# Patient Record
Sex: Female | Born: 1965 | Race: White | Hispanic: No | Marital: Married | Smoking: Former smoker
Health system: Southern US, Community
[De-identification: ages and names within clinical notes are randomized; demographics above are authoritative.]

## PROBLEM LIST (undated history)

## (undated) DIAGNOSIS — Z923 Personal history of irradiation: Secondary | ICD-10-CM

## (undated) DIAGNOSIS — IMO0001 Reserved for inherently not codable concepts without codable children: Secondary | ICD-10-CM

## (undated) DIAGNOSIS — M069 Rheumatoid arthritis, unspecified: Secondary | ICD-10-CM

## (undated) DIAGNOSIS — C50412 Malignant neoplasm of upper-outer quadrant of left female breast: Principal | ICD-10-CM

## (undated) DIAGNOSIS — IMO0002 Reserved for concepts with insufficient information to code with codable children: Secondary | ICD-10-CM

## (undated) HISTORY — PX: OTHER SURGICAL HISTORY: SHX169

## (undated) HISTORY — DX: Rheumatoid arthritis, unspecified: M06.9

## (undated) HISTORY — PX: EYE SURGERY: SHX253

## (undated) HISTORY — DX: Malignant neoplasm of upper-outer quadrant of left female breast: C50.412

---

## 1999-05-16 ENCOUNTER — Other Ambulatory Visit: Admission: RE | Admit: 1999-05-16 | Discharge: 1999-05-16 | Payer: Self-pay | Admitting: Obstetrics & Gynecology

## 2000-09-16 ENCOUNTER — Other Ambulatory Visit: Admission: RE | Admit: 2000-09-16 | Discharge: 2000-09-16 | Payer: Self-pay | Admitting: Obstetrics & Gynecology

## 2002-04-20 ENCOUNTER — Other Ambulatory Visit: Admission: RE | Admit: 2002-04-20 | Discharge: 2002-04-20 | Payer: Self-pay | Admitting: Obstetrics & Gynecology

## 2003-06-22 ENCOUNTER — Other Ambulatory Visit: Admission: RE | Admit: 2003-06-22 | Discharge: 2003-06-22 | Payer: Self-pay | Admitting: Obstetrics & Gynecology

## 2004-08-23 ENCOUNTER — Other Ambulatory Visit: Admission: RE | Admit: 2004-08-23 | Discharge: 2004-08-23 | Payer: Self-pay | Admitting: Obstetrics & Gynecology

## 2005-03-08 ENCOUNTER — Encounter: Admission: RE | Admit: 2005-03-08 | Discharge: 2005-03-08 | Payer: Self-pay | Admitting: Obstetrics & Gynecology

## 2005-03-26 ENCOUNTER — Encounter: Admission: RE | Admit: 2005-03-26 | Discharge: 2005-03-26 | Payer: Self-pay | Admitting: Obstetrics & Gynecology

## 2006-01-02 ENCOUNTER — Other Ambulatory Visit: Admission: RE | Admit: 2006-01-02 | Discharge: 2006-01-02 | Payer: Self-pay | Admitting: Obstetrics & Gynecology

## 2006-04-02 ENCOUNTER — Encounter: Admission: RE | Admit: 2006-04-02 | Discharge: 2006-04-02 | Payer: Self-pay | Admitting: Obstetrics & Gynecology

## 2007-05-22 ENCOUNTER — Encounter: Admission: RE | Admit: 2007-05-22 | Discharge: 2007-05-22 | Payer: Self-pay | Admitting: Obstetrics & Gynecology

## 2007-06-19 ENCOUNTER — Ambulatory Visit (HOSPITAL_COMMUNITY): Admission: RE | Admit: 2007-06-19 | Discharge: 2007-06-19 | Payer: Self-pay | Admitting: Obstetrics & Gynecology

## 2008-05-31 ENCOUNTER — Encounter: Admission: RE | Admit: 2008-05-31 | Discharge: 2008-05-31 | Payer: Self-pay | Admitting: Obstetrics & Gynecology

## 2009-06-01 ENCOUNTER — Encounter: Admission: RE | Admit: 2009-06-01 | Discharge: 2009-06-01 | Payer: Self-pay | Admitting: Obstetrics & Gynecology

## 2010-06-05 ENCOUNTER — Encounter: Admission: RE | Admit: 2010-06-05 | Discharge: 2010-06-05 | Payer: Self-pay | Admitting: Obstetrics & Gynecology

## 2010-12-31 ENCOUNTER — Encounter: Payer: Self-pay | Admitting: Obstetrics & Gynecology

## 2011-06-07 ENCOUNTER — Other Ambulatory Visit: Payer: Self-pay | Admitting: Obstetrics & Gynecology

## 2011-06-07 DIAGNOSIS — Z1231 Encounter for screening mammogram for malignant neoplasm of breast: Secondary | ICD-10-CM

## 2011-06-21 ENCOUNTER — Ambulatory Visit
Admission: RE | Admit: 2011-06-21 | Discharge: 2011-06-21 | Disposition: A | Discharge status: Home / Self Care | Payer: BC Managed Care – PPO | Source: Ambulatory Visit | Attending: Obstetrics & Gynecology | Admitting: Obstetrics & Gynecology

## 2011-06-21 DIAGNOSIS — Z1231 Encounter for screening mammogram for malignant neoplasm of breast: Secondary | ICD-10-CM

## 2012-05-30 ENCOUNTER — Other Ambulatory Visit: Payer: Self-pay | Admitting: Obstetrics & Gynecology

## 2012-05-30 DIAGNOSIS — Z1231 Encounter for screening mammogram for malignant neoplasm of breast: Secondary | ICD-10-CM

## 2012-06-16 ENCOUNTER — Ambulatory Visit
Admission: RE | Admit: 2012-06-16 | Discharge: 2012-06-16 | Disposition: A | Discharge status: Home / Self Care | Payer: BC Managed Care – PPO | Source: Ambulatory Visit | Attending: Obstetrics & Gynecology | Admitting: Obstetrics & Gynecology

## 2012-06-16 DIAGNOSIS — Z1231 Encounter for screening mammogram for malignant neoplasm of breast: Secondary | ICD-10-CM

## 2012-06-23 ENCOUNTER — Ambulatory Visit: Payer: BC Managed Care – PPO

## 2013-05-28 ENCOUNTER — Other Ambulatory Visit: Payer: Self-pay

## 2013-05-28 DIAGNOSIS — Z1231 Encounter for screening mammogram for malignant neoplasm of breast: Secondary | ICD-10-CM

## 2013-06-23 ENCOUNTER — Ambulatory Visit
Admission: RE | Admit: 2013-06-23 | Discharge: 2013-06-23 | Disposition: A | Discharge status: Home / Self Care | Payer: BC Managed Care – PPO | Source: Ambulatory Visit

## 2013-06-23 DIAGNOSIS — Z1231 Encounter for screening mammogram for malignant neoplasm of breast: Secondary | ICD-10-CM

## 2014-05-27 ENCOUNTER — Other Ambulatory Visit: Payer: Self-pay

## 2014-05-27 DIAGNOSIS — Z1231 Encounter for screening mammogram for malignant neoplasm of breast: Secondary | ICD-10-CM

## 2014-06-24 ENCOUNTER — Ambulatory Visit
Admission: RE | Admit: 2014-06-24 | Discharge: 2014-06-24 | Disposition: A | Discharge status: Home / Self Care | Payer: BC Managed Care – PPO | Source: Ambulatory Visit

## 2014-06-24 DIAGNOSIS — Z1231 Encounter for screening mammogram for malignant neoplasm of breast: Secondary | ICD-10-CM

## 2014-12-10 DIAGNOSIS — Z923 Personal history of irradiation: Secondary | ICD-10-CM

## 2014-12-10 DIAGNOSIS — C50919 Malignant neoplasm of unspecified site of unspecified female breast: Secondary | ICD-10-CM

## 2014-12-10 HISTORY — PX: BREAST LUMPECTOMY: SHX2

## 2014-12-10 HISTORY — DX: Personal history of irradiation: Z92.3

## 2014-12-10 HISTORY — DX: Malignant neoplasm of unspecified site of unspecified female breast: C50.919

## 2015-05-27 ENCOUNTER — Other Ambulatory Visit: Payer: Self-pay

## 2015-05-27 DIAGNOSIS — Z1231 Encounter for screening mammogram for malignant neoplasm of breast: Secondary | ICD-10-CM

## 2015-06-28 ENCOUNTER — Ambulatory Visit: Payer: Self-pay

## 2015-07-11 ENCOUNTER — Ambulatory Visit
Admission: RE | Admit: 2015-07-11 | Discharge: 2015-07-11 | Disposition: A | Discharge status: Home / Self Care | Payer: BLUE CROSS/BLUE SHIELD | Source: Ambulatory Visit

## 2015-07-11 DIAGNOSIS — Z1231 Encounter for screening mammogram for malignant neoplasm of breast: Secondary | ICD-10-CM

## 2015-07-14 ENCOUNTER — Other Ambulatory Visit: Payer: Self-pay | Admitting: Obstetrics & Gynecology

## 2015-07-14 DIAGNOSIS — R928 Other abnormal and inconclusive findings on diagnostic imaging of breast: Secondary | ICD-10-CM

## 2015-07-15 ENCOUNTER — Other Ambulatory Visit: Payer: Self-pay | Admitting: Obstetrics & Gynecology

## 2015-07-15 ENCOUNTER — Ambulatory Visit
Admission: RE | Admit: 2015-07-15 | Discharge: 2015-07-15 | Disposition: A | Discharge status: Home / Self Care | Payer: BLUE CROSS/BLUE SHIELD | Source: Ambulatory Visit | Attending: Obstetrics & Gynecology | Admitting: Obstetrics & Gynecology

## 2015-07-15 DIAGNOSIS — R928 Other abnormal and inconclusive findings on diagnostic imaging of breast: Secondary | ICD-10-CM

## 2015-08-05 ENCOUNTER — Other Ambulatory Visit: Payer: Self-pay | Admitting: Obstetrics & Gynecology

## 2015-08-05 ENCOUNTER — Ambulatory Visit
Admission: RE | Admit: 2015-08-05 | Discharge: 2015-08-05 | Disposition: A | Discharge status: Home / Self Care | Payer: BLUE CROSS/BLUE SHIELD | Source: Ambulatory Visit | Attending: Obstetrics & Gynecology | Admitting: Obstetrics & Gynecology

## 2015-08-05 DIAGNOSIS — R928 Other abnormal and inconclusive findings on diagnostic imaging of breast: Secondary | ICD-10-CM

## 2015-08-11 ENCOUNTER — Other Ambulatory Visit: Payer: Self-pay | Admitting: General Surgery

## 2015-08-11 DIAGNOSIS — C50912 Malignant neoplasm of unspecified site of left female breast: Secondary | ICD-10-CM

## 2015-08-12 ENCOUNTER — Encounter: Payer: Self-pay | Admitting: *Deleted

## 2015-08-12 ENCOUNTER — Other Ambulatory Visit: Payer: Self-pay | Admitting: *Deleted

## 2015-08-12 ENCOUNTER — Telehealth: Payer: Self-pay | Admitting: *Deleted

## 2015-08-12 DIAGNOSIS — Z170 Estrogen receptor positive status [ER+]: Secondary | ICD-10-CM

## 2015-08-12 DIAGNOSIS — C50412 Malignant neoplasm of upper-outer quadrant of left female breast: Secondary | ICD-10-CM

## 2015-08-12 HISTORY — DX: Malignant neoplasm of upper-outer quadrant of left female breast: C50.412

## 2015-08-12 NOTE — Telephone Encounter (Signed)
Scheduled and confirmed new pt appt with Dr. Jana Hakim on 08/16/15 at 4/4:30. Pt relate she does not want to schedule genetics until she has discussed this with Dr. Jana Hakim.

## 2015-08-16 ENCOUNTER — Ambulatory Visit (HOSPITAL_BASED_OUTPATIENT_CLINIC_OR_DEPARTMENT_OTHER): Payer: BLUE CROSS/BLUE SHIELD | Admitting: Oncology

## 2015-08-16 ENCOUNTER — Encounter: Payer: Self-pay | Admitting: Oncology

## 2015-08-16 ENCOUNTER — Other Ambulatory Visit (HOSPITAL_BASED_OUTPATIENT_CLINIC_OR_DEPARTMENT_OTHER): Payer: BLUE CROSS/BLUE SHIELD

## 2015-08-16 VITALS — BP 138/77 | HR 86 | Temp 98.9°F | Resp 18 | Ht 70.0 in | Wt 227.9 lb

## 2015-08-16 DIAGNOSIS — M069 Rheumatoid arthritis, unspecified: Secondary | ICD-10-CM

## 2015-08-16 DIAGNOSIS — Z170 Estrogen receptor positive status [ER+]: Secondary | ICD-10-CM

## 2015-08-16 DIAGNOSIS — C50412 Malignant neoplasm of upper-outer quadrant of left female breast: Secondary | ICD-10-CM

## 2015-08-16 DIAGNOSIS — D0512 Intraductal carcinoma in situ of left breast: Secondary | ICD-10-CM

## 2015-08-16 LAB — CBC WITH DIFFERENTIAL/PLATELET
BASO%: 1 % (ref 0.0–2.0)
Basophils Absolute: 0.1 10e3/uL (ref 0.0–0.1)
EOS%: 2.8 % (ref 0.0–7.0)
Eosinophils Absolute: 0.3 10e3/uL (ref 0.0–0.5)
HCT: 39.9 % (ref 34.8–46.6)
HGB: 13.5 g/dL (ref 11.6–15.9)
LYMPH%: 34.3 % (ref 14.0–49.7)
MCH: 32.9 pg (ref 25.1–34.0)
MCHC: 33.8 g/dL (ref 31.5–36.0)
MCV: 97.3 fL (ref 79.5–101.0)
MONO#: 0.8 10e3/uL (ref 0.1–0.9)
MONO%: 7.1 % (ref 0.0–14.0)
NEUT#: 5.9 10e3/uL (ref 1.5–6.5)
NEUT%: 54.8 % (ref 38.4–76.8)
Platelets: 272 10e3/uL (ref 145–400)
RBC: 4.1 10e6/uL (ref 3.70–5.45)
RDW: 13.1 % (ref 11.2–14.5)
WBC: 10.7 10e3/uL — ABNORMAL HIGH (ref 3.9–10.3)
lymph#: 3.7 10e3/uL — ABNORMAL HIGH (ref 0.9–3.3)

## 2015-08-16 LAB — COMPREHENSIVE METABOLIC PANEL (CC13)
ALBUMIN: 4.2 g/dL (ref 3.5–5.0)
ALK PHOS: 53 U/L (ref 40–150)
ALT: 25 U/L (ref 0–55)
ANION GAP: 9 meq/L (ref 3–11)
AST: 18 U/L (ref 5–34)
BILIRUBIN TOTAL: 0.3 mg/dL (ref 0.20–1.20)
BUN: 11.2 mg/dL (ref 7.0–26.0)
CALCIUM: 9.5 mg/dL (ref 8.4–10.4)
CHLORIDE: 107 meq/L (ref 98–109)
CO2: 23 mEq/L (ref 22–29)
CREATININE: 0.8 mg/dL (ref 0.6–1.1)
EGFR: 90 mL/min/{1.73_m2} — ABNORMAL LOW (ref >90)
Glucose: 104 mg/dl (ref 70–140)
Potassium: 3.6 mEq/L (ref 3.5–5.1)
Sodium: 139 mEq/L (ref 136–145)
TOTAL PROTEIN: 7.5 g/dL (ref 6.4–8.3)

## 2015-08-16 NOTE — Progress Notes (Signed)
[place]  Telephone:(336) [phone] Fax:(336) [phone]     ID: Kim Porter DOB: 11/05/1966  MR#: [id number]  IOX#:[id number]  Patient Care Team: Jefm Petty, MD as PCP - General (Family Medicine) Vania Rea, MD as Consulting Physician (Obstetrics and Gynecology) Chauncey Cruel, MD as Consulting Physician (Oncology) Fanny Skates, MD as Consulting Physician (General Surgery) PCP: Wonda Cerise, MD OTHER MD: Dyke Brackett MD, Nena Polio MD  CHIEF COMPLAINT: ductal carcinoma in situ  CURRENT TREATMENT: awaiting definitive surgery   BREAST CANCER HISTORY: Kim Porter had bilateral screening mammography with tomography 07/11/2015 showing calcifications in the left breast, requiring further imaging.on 07/15/2015 she underwent a diagnostic left mammography which showed the breast density to be category C. 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  Her subsequent history is as detailed below.  INTERVAL HISTORY:  Kim Porter was evaluated in the breast clinic 08/16/2015 accompanied by her mother Kim Porter  REVIEW OF SYSTEMS: There were no specific symptoms leading to the original mammogram, which was routinely scheduled. The patient denies unusual headaches, visual changes, nausea, vomiting, stiff neck, dizziness, or gait imbalance. There has been no cough, phlegm production, or pleurisy, no chest pain or pressure, and no change in bowel or bladder habits. The patient denies fever, rash, bleeding, unexplained fatigue or unexplained weight loss.  The patient does have a history of rheumatoid arthritis. She stopped her medications (the adalimumab  5 weeks ago, the methotrexate 4 weeks ago ) without any evidence of  flare.  In fact she is asymptomatic from this problem.A detailed review of systems was otherwise entirely negative.  PAST MEDICAL HISTORY: Past Medical History  Diagnosis Date  . Breast cancer of upper-outer quadrant of left female breast 08/12/2015  . Rheumatoid arthritis     PAST SURGICAL HISTORY: No past surgical history on file.  FAMILY HISTORY No family history on file.  the patient's parents are still living, in their late 48s. The patient's mother was diagnosed with breast cancer the age of 23. The maternal grandmother may have had breast cancer diagnosed at age 58. 75 of the patient's mother's maternal aunts were diagnosed with breast cancer at the ages of 59 and 61. The patient also has a second cousin diagnosed with breast cancer at age 4. This cousin has been tested for a mutation  ( extensive panel) and no mutation was found  GYNECOLOGIC HISTORY:  No LMP recorded.  Menarche age 49 , the patient is GX P0. She is still having regular periods. 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 Her husband, Kim Porter, is a Customer service manager. They have land and keep approximately 75 horses, many of them their son otherwise boarded. They also have 3 dogs and one cat.    ADVANCED DIRECTIVES:  Not in place   HEALTH MAINTENANCE: Social History  Substance Use Topics  . Smoking status: Not on file  . Smokeless tobacco: Not on file  . Alcohol Use: Not on file     Colonoscopy:  PAP:  Bone density:  Lipid panel:  No Known Allergies  No current outpatient prescriptions on file.   No current facility-administered medications for this visit.    OBJECTIVE:  Middle-aged white woman  who appears stated age 87 Vitals:   08/16/15 1630  BP: 138/77  Pulse: 86  Temp: 98.9 F (37.2 C)  Resp: 18     Body mass index is 32.7 kg/(m^2).    ECOG FS:0 - Asymptomatic  Ocular: Sclerae unicteric, pupils equal,  round and reactive to light Ear-nose-throat: Oropharynx clear and moist Lymphatic: No cervical or supraclavicular adenopathy Lungs no rales or rhonchi, good excursion bilaterally Heart regular rate and rhythm, no murmur appreciated Abd soft, nontender, positive bowel sounds MSK no focal spinal tenderness, no joint edema Neuro: non-focal, well-oriented, appropriate affect Breasts:  The right breast is unremarkable. In the left breast I do not palpate a mass. There are no skin or nipple changes of concern. The left axilla is benign.   LAB RESULTS:  CMP     Component Value Date/Time   NA 139 08/16/2015 1620   K 3.6 08/16/2015 1620   CO2 23 08/16/2015 1620   GLUCOSE 104 08/16/2015 1620   BUN 11.2 08/16/2015 1620   CREATININE 0.8 08/16/2015 1620   CALCIUM 9.5 08/16/2015 1620   PROT 7.5 08/16/2015 1620   ALBUMIN 4.2 08/16/2015 1620   AST 18 08/16/2015 1620   ALT 25 08/16/2015 1620   ALKPHOS 53 08/16/2015 1620   BILITOT 0.30 08/16/2015 1620    INo results found for: SPEP, UPEP  Lab Results  Component Value Date   WBC 10.7* 08/16/2015   NEUTROABS 5.9 08/16/2015   HGB 13.5 08/16/2015   HCT 39.9 08/16/2015   MCV 97.3 08/16/2015   PLT 272 08/16/2015      Chemistry      Component Value Date/Time   NA 139 08/16/2015 1620   K 3.6 08/16/2015 1620   CO2 23 08/16/2015 1620   BUN 11.2 08/16/2015 1620   CREATININE 0.8 08/16/2015 1620      Component Value Date/Time   CALCIUM 9.5 08/16/2015 1620   ALKPHOS 53 08/16/2015 1620   AST 18 08/16/2015 1620   ALT 25 08/16/2015 1620   BILITOT 0.30 08/16/2015 1620       No results found for: LABCA2  No components found for: LABCA125  No results for input(s): INR in the last 168 hours.  Urinalysis No results found for: COLORURINE, APPEARANCEUR, LABSPEC, PHURINE, GLUCOSEU, HGBUR, BILIRUBINUR, KETONESUR, PROTEINUR, UROBILINOGEN, NITRITE, LEUKOCYTESUR  STUDIES: Mm Digital Diagnostic Unilat L  08/05/2015   CLINICAL DATA:  Status  post stereotactic biopsy of left breast upper outer quadrant calcifications.  EXAM: DIAGNOSTIC LEFT MAMMOGRAM POST STEREOTACTIC BIOPSY  COMPARISON:  Previous exam(s).  FINDINGS: Mammographic images were obtained following stereotactic guided biopsy of left breast upper outer quadrant calcifications, posterior depth. Two-view mammography demonstrates presence of coil shaped marker within the biopsy site in the left breast upper outer quadrant, posterior depth. Residual calcifications are seen. Expected post biopsy changes are also noted.  IMPRESSION: Post biopsy metallic marker placement within the left breast upper outer quadrant, status post stereotactic core needle biopsy.  Final Assessment: Post Procedure Mammograms for Marker Placement   Electronically Signed   By: Fidela Salisbury M.D.   On: 08/05/2015 14:26   Mm Lt Breast Bx W Loc Dev 1st Lesion Image Bx Spec Stereo Guide  08/08/2015   ADDENDUM REPORT: 08/08/2015 15:35  ADDENDUM: Pathology reveals high grade ductal carcinoma in situ with necrosis and calcifications of the Left breast. This was found to be concordant by Dr. Fidela Salisbury. Pathology results were discussed with the patient via telephone. The patient reported no problems with the biopsy  site and is doing well. Post biopsy instructions were reviewed and questions were answered. The patient was encouraged to call [facility] with any additional questions and or concerns. A surgical referral was arranged with Dr. Fanny Skates of [facility] on August 11, 2015.  Pathology results reported by Terie Purser RN on August 08, 2015.   Electronically Signed   By: Fidela Salisbury M.D.   On: 08/08/2015 15:35   08/08/2015   CLINICAL DATA:  Left breast upper outer quadrant indeterminate calcifications seen mammographically.  EXAM: LEFT BREAST STEREOTACTIC CORE NEEDLE BIOPSY  COMPARISON:  Previous exams.  FINDINGS: The patient and I discussed the  procedure of stereotactic-guided biopsy including benefits and alternatives. We discussed the high likelihood of a successful procedure. We discussed the risks of the procedure including infection, bleeding, tissue injury, clip migration, and inadequate sampling. Informed written consent was given. The usual time out protocol was performed immediately prior to the procedure.  Using sterile technique and 2% Lidocaine as local anesthetic, under stereotactic guidance, a 9 gauge vacuum assisted device was used to perform core needle biopsy of calcifications in the left breast upper outer quadrant, posterior depth, using a superior approach. Specimen radiograph was performed showing presence of calcifications within the specimen. Specimens with calcifications are identified for pathology.  At the conclusion of the procedure, a coil shaped tissue marker clip was deployed into the biopsy cavity. Follow-up 2-view mammogram was performed and dictated separately.  IMPRESSION: Stereotactic-guided biopsy of left breast upper outer quadrant calcifications. No apparent complications.  Electronically Signed: By: Fidela Salisbury M.D. On: 08/05/2015 14:15    ASSESSMENT: 49 y.o. [place] woman status post left breast upper outer quadrant biopsy 08/05/2015 for ductal carcinoma in situ, high-grade, estrogen and progesterone receptor positive   (1) genetics testing pending   (2) definitive surgery pending  (3) adjuvant radiation to follow surgery   (4) tamoxifen to follow radiation  PLAN: We spent the better part of today's hour-long appointment discussing the biology of breast cancer in general, and the specifics of the patient's tumor in particular. The patient understands that in noninvasive ductal carcinoma, also called ductal carcinoma in situ ("DCIS") the breast cancer cells remain trapped in the ducts were they started. They cannot travel to a vital organ. For that reason these cancers in themselves are not  life-threatening.  If the whole breast is removed then all the ducts are removed and since the cancer cells are trapped in the ducts, the cure rate with mastectomy for noninvasive breast cancer is approximately 99%. Nevertheless we recommend lumpectomy, because there is no survival advantage to mastectomy and because the cosmetic result is generally superior with breast conservation.  In patients who carry a deleterious mutations such as for example BRCA, the risk of a new breast cancer developing may be sufficiently great that the patient may choose bilateral mastectomies. However if the patient wishes to keep her breasts in that situation it is safe to do so. That would require intensified screening, which generally means not only yearly mammography but a yearly breast MRI as well. Of course, if there is a deleterious mutation bilateral oophorectomy would be necessary as there is no standard screening protocol for ovarian cancer.   after this discussion, the key question for Kim Porter, if we did find a deleterious mutation-like BRCA which she want bilateral mastectomies or choose instead intensive screening. She was very clear she would choose intensive screening. Accordingly there is no need to  delay her surgery and she may proceed as planned.    I am setting her up for genetics testing sometime within the next week or so. I am also setting her up with one of our radiation oncologists sometime early October. I will see the patient around that time. She and her husband have a very large show which goes into November. If she is going to delay her radiation, which I think will be her choice, we can start tamoxifen early October when she has recovered from her surgery and then she doesn't have to worry about  The radiation delay.   Kim Porter has a good understanding of the overall plan. She agrees with it. She knows the goal of treatment in her case is cure. She will call with any problems that may develop  before her next visit here.  Chauncey Cruel, MD   08/16/2015 8:18 PM Medical Oncology and Hematology [facility] [address] [place], [place] [address] Tel. [phone]    Fax. [phone]

## 2015-08-17 ENCOUNTER — Encounter: Payer: Self-pay | Admitting: Genetic Counselor

## 2015-08-17 ENCOUNTER — Other Ambulatory Visit: Payer: Self-pay | Admitting: General Surgery

## 2015-08-17 ENCOUNTER — Telehealth: Payer: Self-pay | Admitting: Oncology

## 2015-08-17 DIAGNOSIS — C50912 Malignant neoplasm of unspecified site of left female breast: Secondary | ICD-10-CM

## 2015-08-17 NOTE — Telephone Encounter (Signed)
Left message to confirm appointment for 09/15 & 10/03

## 2015-08-19 ENCOUNTER — Ambulatory Visit
Admission: RE | Admit: 2015-08-19 | Discharge: 2015-08-19 | Disposition: A | Discharge status: Home / Self Care | Payer: BLUE CROSS/BLUE SHIELD | Source: Ambulatory Visit | Attending: General Surgery | Admitting: General Surgery

## 2015-08-19 DIAGNOSIS — C50912 Malignant neoplasm of unspecified site of left female breast: Secondary | ICD-10-CM

## 2015-08-19 MED ORDER — GADOBENATE DIMEGLUMINE 529 MG/ML IV SOLN
20.0000 mL | Freq: Once | INTRAVENOUS | Status: AC | PRN
  Administered 2015-08-19: 20 mL via INTRAVENOUS

## 2015-08-22 ENCOUNTER — Other Ambulatory Visit: Payer: Self-pay | Admitting: General Surgery

## 2015-08-22 ENCOUNTER — Encounter: Payer: Self-pay | Admitting: Oncology

## 2015-08-22 DIAGNOSIS — C50412 Malignant neoplasm of upper-outer quadrant of left female breast: Secondary | ICD-10-CM

## 2015-08-22 NOTE — Progress Notes (Signed)
Called patient and introduced myself. Explained to patient my role as a financial advocate and asked pt if she had any questions or concerns. Pt asked about genetic testing. I advised pt that would be authorized through her insurance company. Treatment plan has not yet been established but explained what we offer if chemo therapy is a part of her treatment plan. Pt has my name and number for any other questions or concerns.

## 2015-08-25 ENCOUNTER — Ambulatory Visit (HOSPITAL_BASED_OUTPATIENT_CLINIC_OR_DEPARTMENT_OTHER): Payer: BLUE CROSS/BLUE SHIELD | Admitting: Genetic Counselor

## 2015-08-25 ENCOUNTER — Other Ambulatory Visit: Payer: BLUE CROSS/BLUE SHIELD

## 2015-08-25 ENCOUNTER — Encounter: Payer: Self-pay | Admitting: Genetic Counselor

## 2015-08-25 DIAGNOSIS — Z809 Family history of malignant neoplasm, unspecified: Secondary | ICD-10-CM | POA: Diagnosis not present

## 2015-08-25 DIAGNOSIS — D0512 Intraductal carcinoma in situ of left breast: Secondary | ICD-10-CM | POA: Diagnosis not present

## 2015-08-25 DIAGNOSIS — C50412 Malignant neoplasm of upper-outer quadrant of left female breast: Secondary | ICD-10-CM

## 2015-08-25 DIAGNOSIS — Z803 Family history of malignant neoplasm of breast: Secondary | ICD-10-CM | POA: Diagnosis not present

## 2015-08-25 DIAGNOSIS — Z315 Encounter for genetic counseling: Secondary | ICD-10-CM

## 2015-08-25 DIAGNOSIS — Z808 Family history of malignant neoplasm of other organs or systems: Secondary | ICD-10-CM

## 2015-08-25 NOTE — Progress Notes (Signed)
REFERRING PROVIDER: Lurline Del, MD  PRIMARY PROVIDER:  Wonda Cerise, MD  PRIMARY REASON FOR VISIT:  1. Breast cancer of upper-outer quadrant of left female breast   2. Family history of breast cancer in mother   43. Family history of breast cancer in female   62. Family history of cancer   5. Family history of cancer in maternal grandmother      HISTORY OF PRESENT ILLNESS:   Kim Porter, a 49 y.o. female, was seen for a [place] cancer genetics consultation at the request of Dr. Jana Porter due to a personal history of breast cancer at 12 and family history of breast cancer in her mother and additional maternal relatives.  Kim Porter presents to clinic today to discuss the possibility of a hereditary predisposition to cancer, genetic testing, and to further clarify her future cancer risks, as well as potential cancer risks for family members.   In 2016, at the age of 46, Kim Porter was diagnosed with DCIS of the left breast.  Hormone status is ER and PR+. This will be treated with lumpectomy.   CANCER HISTORY:   No history exists.     HORMONAL RISK FACTORS:  Menarche was at age 50.  First live birth at age - no children.  OCP use for approximately 14  years.  Ovaries intact: yes.  Hysterectomy: no.  Menopausal status: premenopausal.  HRT use: 0 years. Colonoscopy: no; not examined. Mammogram within the last year: yes. Number of breast biopsies: 1. Up to date with pelvic exams:  Has one scheduled for next week. Any excessive radiation exposure in the past:  no  Past Medical History  Diagnosis Date  . Breast cancer of upper-outer quadrant of left female breast 08/12/2015  . Rheumatoid arthritis     History reviewed. No pertinent past surgical history.  Social History   Social History  . Marital Status: Married    Spouse Name: N/A  . Number of Children: N/A  . Years of Education: N/A   Social History Main Topics  . Smoking status: Former Smoker    Types:  Cigarettes  . Smokeless tobacco: Never Used     Comment: 1 ppwk for 4 yrs in college  . Alcohol Use: 0.6 oz/week    1 Cans of beer per week  . Drug Use: None  . Sexual Activity: Not Asked   Other Topics Concern  . None   Social History Narrative     FAMILY HISTORY:  We obtained a detailed, 4-generation family history.  Significant diagnoses are listed below: Family History  Problem Relation Age of Onset  . Breast cancer Mother 51  . Colon polyps Mother 49  . Other Sister     has had "7 lumpectomies, all benign"  . Diabetes Sister   . Cancer Maternal Grandmother     unknown primary - metastasized  . Emphysema Maternal Grandfather   . Cancer Maternal Uncle 61    unspecified type; "caused by agent orange"  . Breast cancer Other     (x2); dx. 30 and 75  . Breast cancer Cousin 66    multiple primaries; negative genetic testing   . Alzheimer's disease Other     Kim Porter has one full sister who is currently 105.  This sister has never had cancer, but has a history of seven benign breast lumpectomies.  Kim Porter has one niece who is currently 45 and who has not had cancer.  Kim Porter parents are currently living--both  in their mid-late 36s.  Kim Porter mother has a history of breast cancer diagnosed at 40.  She also has a history of colon polyps (reportedly "all okay") first diagnosed at the age of 64, but Kim Porter is unaware of how many she has had.  Kim Porter father has never been diagnosed with cancer and has never had any colon polyps.  Kim Porter mother has two full brothers that have passed away.  One died at the age of 60 of an unspecified type of cancer, first diagnosed at 78.  This cancer was reportedly caused by exposure to agent orange while in the [employer].  Kim Porter other maternal uncle died at the age of 40, following prolonged illness.  This uncle did not have cancer to Kim Porter's knowledge.  Through these maternal uncles, Kim Porter has three female and four  female cousins--none of whom have had cancer.  Kim Porter maternal grandmother died of metastatic cancer at 52--the primary is unknown.  This grandmother had three full sisters.  One sister died at a young age and had intellectual disabilities.  The other two sisters had a history of breast cancer; one was diagnosed at 61, the other at 46.  One of these great aunts had no children, the other had one son and one daughter.  This daughter was diagnosed with breast cancer at 64 and has had multiple primary cancers and negative genetic testing within the last two years.  Kim Porter maternal grandfather died at 51 with emphysema, but did not have cancer.  Kim Porter father had multiple half-siblings, many of whom she has no information on.  She is aware of two half-brothers and one half-sister.  One brother has passed away, the other is currently 46 and has not had cancer.  The half-sister is currently 22 and also has not had cancer.  Kim Porter paternal grandmother died following childbirth at the age of 27.  Kim Porter paternal grandfather died at 62 from cirrhosis of the liver.  She is unaware of any cancer diagnoses on this side of the family.  Patient's maternal ancestors are of Caucasian descent, and paternal ancestors are of English descent. There is no reported Ashkenazi Jewish ancestry. There is no known consanguinity.  GENETIC COUNSELING ASSESSMENT: Kim Porter is a 49 y.o. female with a personal and family history of breast cancer which is somewhat suggestive of a hereditary breast cancer syndrome and predisposition to cancer. We, therefore, discussed and recommended the following at today's visit.   DISCUSSION: We reviewed the characteristics, features and inheritance patterns of hereditary cancer syndromes, particularly those caused by mutations within the BRCA1/2 genes. We also discussed genetic testing, including the appropriate family members to test, the process of testing, insurance coverage  and turn-around-time for results. We discussed the implications of a negative, positive and/or variant of uncertain significant result. We recommended Ms. Imparato pursue genetic testing for the 20-gene Breast/Ovarian Cancer Panel through [facility] Hope Pigeon, MD).  The Breast/Ovarian Cancer Panel offered by [facility] includes sequencing and deletion/duplication analysis for the following 19 genes:  ATM, BARD1, BRCA1, BRCA2, BRIP1, CDH1, CHEK2, FANCC, MLH1, MSH2, MSH6, NBN, PALB2, PMS2, PTEN, RAD51C, RAD51D, TP53, and XRCC2.  This panel also includes deletion/duplication analysis (without sequencing) for one gene, EPCAM.   Based on Ms. Hopkinson's personal and family history of cancer, she meets medical criteria for genetic testing. Despite that she meets criteria, she may still have an out of pocket cost. We discussed that if her out of  pocket cost for testing is over $100, the laboratory will call and confirm whether she wants to proceed with testing.  If the out of pocket cost of testing is less than $100 she will be billed by the genetic testing laboratory.   PLAN: After considering the risks, benefits, and limitations, Ms. Yerian  provided informed consent to pursue genetic testing and the oral rinse sample was sent to [facility] for analysis of the 20-gene Breast/Ovarian Cancer Panel test. Results are generally available within approximately 2-3 weeks' time, however, we will let the lab know that we need the test results STAT as this information will be helpful in regards Ms. Comas's pending treatment decisions.  Thus, we should get these results back nearer to 2 weeks time.   At that point in time, they will be disclosed by telephone to Ms. Cutshaw, as will any additional recommendations warranted by these results. Ms. Nolden will receive a summary of her genetic counseling visit and a copy of her results once available. This information will also be available in Epic. We encouraged Ms. Kiernan to  remain in contact with cancer genetics annually so that we can continuously update the family history and inform her of any changes in cancer genetics and testing that may be of benefit for her family. Ms. Lyvers questions were answered to her satisfaction today. Our contact information was provided should additional questions or concerns arise.  Thank you for the referral and allowing [place] to share in the care of your patient.   Jeanine Luz, MS Genetic Counselor [email or web address] Phone: [phone]  The patient was seen for a total of 60 minutes in face-to-face genetic counseling.  This patient was discussed with Drs. Magrinat, Lindi Adie and/or Burr Medico who agrees with the above.    _______________________________________________________________________ For Office Staff:  Number of people involved in session: 1 Was an Intern/ student involved with case: no

## 2015-08-26 ENCOUNTER — Other Ambulatory Visit: Payer: Self-pay | Admitting: General Surgery

## 2015-08-26 DIAGNOSIS — C50412 Malignant neoplasm of upper-outer quadrant of left female breast: Secondary | ICD-10-CM

## 2015-08-29 ENCOUNTER — Encounter (HOSPITAL_BASED_OUTPATIENT_CLINIC_OR_DEPARTMENT_OTHER): Payer: Self-pay | Admitting: *Deleted

## 2015-08-30 ENCOUNTER — Other Ambulatory Visit: Payer: Self-pay | Admitting: Obstetrics & Gynecology

## 2015-08-30 ENCOUNTER — Ambulatory Visit
Admission: RE | Admit: 2015-08-30 | Discharge: 2015-08-30 | Disposition: A | Discharge status: Home / Self Care | Payer: BLUE CROSS/BLUE SHIELD | Source: Ambulatory Visit | Attending: General Surgery | Admitting: General Surgery

## 2015-08-30 DIAGNOSIS — C50412 Malignant neoplasm of upper-outer quadrant of left female breast: Secondary | ICD-10-CM

## 2015-08-30 NOTE — Progress Notes (Signed)
Received [facility] report on patient.  Dr. Jana Hakim to review then send to scanning.

## 2015-08-31 LAB — CYTOLOGY - PAP

## 2015-08-31 NOTE — H&P (Signed)
Domenic Schwab  Location: [facility] Patient #: [id number] DOB: May 27, 1966 Married / Language: English / Race: White Female       History of Present Illness  The patient is a 49 year old female who presents with breast cancer. She is here for final discussion before her definitive left breast cancer surgery this coming Friday. Her oncologist is Dr. Jana Hakim. Her radiation oncologist is Dr. Pablo Ledger. Dr. Jefm Petty her PCP. Dr. Stann Mainland is her gynecologist. Dr. Estanislado Pandy is her rheumatologist.       Initially she was evaluated on August 11, 2015. Imaging studies showed a new focal cluster of calcifications in the left breast, upper outer quadrant, only 1 cm in diameter, posterior depth. Image guided biopsy showed high-grade DCIS, hormone receptor positive. She has a strong family history for breast cancer in mother, 2 maternal aunts, and 1 cousin who is reportedly genetic testing negative. The patient is strongly motivated for breast conservation.      She has seen Dr. Jana Hakim. She has had genetic testing drawn but it is not out yet. She is scheduled for left breast lumpectomy with radioactive seed localization this Friday.       Her MRI showed abnormal enhancement in the upper outer quadrant of the left breast posteriorly. This was larger, 4.1 x 1.9 x 2.9 cm. I told her that this may or may not represent the extent of her cancer. I told her this was a solitary finding. She knows that this slightly increases her risk of a positive margin and second surgery but hopefully will try to get all of this out on the initial surgery. I discussed the indications, details, techniques, and numerous risk of the surgery with her. She is aware of the risk of bleeding, infection, reoperation for positive margins, possibility of invasive cancer necessitating left eye surgery in the future, cosmetic deformity, nerve damage and chronic pain, and other unforeseen problems. She  knows that she will need radiation therapy, and she is going to see Dr. Pablo Ledger this Thursday. She understands these issues well. This time her questions were answered. She agrees with this plan.       In terms of her rheumatoid arthritis, she has already stopped her Humira and methotrexate and folic acid. I told her that she could probably restart her medications one month postoperative if everything seemed to be healing well. I told her to stop her facial.   Allergies   Drug Allergies09/12/2014  Medication History Fish Oil (1200MG  Capsule, Oral) Active. Methotrexate Sodium (50MG /2ML Solution, Injection) Active. Folic Acid (0.8MG  Capsule, Oral two times daily) Active. Medications Reconciled  Vitals   Weight: 231 lb Temp.: 98.24F  Pulse: 60 (Regular)  BP: 140/80 (Sitting, Left Arm, Standard)    Physical Exam  General Mental Status-Alert. General Appearance-Not in acute distress. Build & Nutrition-Well nourished. Posture-Normal posture. Gait-Normal.  Head and Neck Head-normocephalic, atraumatic with no lesions or palpable masses. Trachea-midline. Thyroid Gland Characteristics - normal size and consistency and no palpable nodules.  Chest and Lung Exam Chest and lung exam reveals -on auscultation, normal breath sounds, no adventitious sounds and normal vocal resonance.  Breast Note: Moderately large. Symmetrical. Tiny biopsy site left breast upper outer superior. No mass or hematoma. No ecchymoses. No axillary adenopathy.   Cardiovascular Cardiovascular examination reveals -normal heart sounds, regular rate and rhythm with no murmurs and femoral artery auscultation bilaterally reveals normal pulses, no bruits, no thrills.  Abdomen Inspection Inspection of the abdomen reveals - No Hernias. Palpation/Percussion Palpation  and Percussion of the abdomen reveal - Soft, Non Tender, No Rigidity (guarding), No hepatosplenomegaly and No  Palpable abdominal masses.  Neurologic Neurologic evaluation reveals -alert and oriented x 3 with no impairment of recent or remote memory, normal attention span and ability to concentrate, normal sensation and normal coordination.  Musculoskeletal Normal Exam - Bilateral-Upper Extremity Strength Normal and Lower Extremity Strength Normal.    Assessment & Plan  PRIMARY CANCER OF UPPER OUTER QUADRANT OF LEFT FEMALE BREAST (C50.412)   Schedule for Surgery You are scheduled for left breast lumpectomy with radioactive seed localization this coming Friday, September 23. The final pathology report will be reported in 3-5 days after that, and I will call you. We reviewed that procedure and its risks in detail today. Keep your appointment with Dr. Pablo Ledger on September 22.   FAMILY HISTORY OF BREAST CANCER IN FEMALE (Z80.3) BMI 32.0-32.9,ADULT (Z68.32) HISTORY OF RHEUMATOID ARTHRITIS (Z87.39)   Edsel Petrin. Dalbert Batman, M.D., [facility] General and Minimally invasive Surgery Breast and Colorectal Surgery Office:   [phone] Pager:   [phone]

## 2015-08-31 NOTE — Progress Notes (Addendum)
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  Receptor Status: ER(100% +), PR (60% +), Her2-neu ()  Did patient present with symptoms (if so, please note symptoms) or was this found on screening mammography?: B/L Screening mammogram  Past/Anticipated interventions by surgeon, if any:Dr. Lattie Corns; scheduled for surgery 09/02/15 (Left breast lumpectomy with radioactive seed localization)  Past/Anticipated interventions by medical oncology, if any: Chemotherapy : Seen in Breast Clinic 08/16/15 by Dr. Jana Hakim  ,genetic testing done Lymphedema issues, if any:   Pain issues, if any:No  SAFETY ISSUES:  Prior radiation? NO  Pacemaker/ICD?  NO  Possible current pregnancy?LMP 1 week ago.  Is the patient on methotrexate? NO,stopped for surgery.off for 12 days./Has rheumatoid arthritis.  Current Complaints / other details:Married, menarche age 84, GxP0, still having regular periods, did use oral contraceptives 34 years,  Mother dx age 50, ,maternal grandmother dx age 60,  ,  2 maternal aunts dx ages 73,72;, 2nd  cousin dx age 33 all with Breast Cancer,  Allergies:NKA    Arlyss Repress, RN 08/31/2015,11:18 AM  BP 117/75 mmHg  Pulse 70  Temp(Src) 98.7 F (37.1 C)  Wt 230 lb 12.8 oz (104.69 kg)  LMP 08/22/2015

## 2015-08-31 NOTE — Progress Notes (Signed)
  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 female who had a screening mammogram on 07/11/15 that found calcifications in the left breast. A diagnostic mammogram on 07/15/15 revealed suspicious left upper out quadrant calcifications. Biopsy on 08/05/15 confirmed high grade DCIS with necrosis and calcifications; ER/PR positive. She had an appointment with Dr. Jana Hakim on 08/16/15 and she is scheduled for surgery tomorrow involving a left breast lumpectomy with radioactive seed localization. She presents to the clinic today for my consideration of radiotherapy for the management of her disease.  PREVIOUS RADIATION THERAPY: No  Past medical, social and family history were reviewed in the electronic chart. Review of symptoms was reviewed in the electronic chart. Medications were reviewed in the electronic chart.   PHYSICAL EXAM:  Filed Vitals:   09/01/15 1105  BP: 117/75  Pulse: 70  Temp: 98.7 F (37.1 C)  .230 lb 12.8 oz (104.69 kg). Pleasant woman who appears her stated age. Alert and oriented times three. In no distress. Intertrigo in the inframammay fold. Bruising in the left breast. NO palpable adenopathy in the cervical and supraclavicular as well as axillary adenopathy.   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 We discussed the equivalence in terms of survival and local failure between mastectomy and breast conservation. We discussed the role of radiation in decreasing local failures in patients who undergo lumpectomy. We discussed the process of simulation and the placement tattoos. We discussed 4 weeks of treatment as an  outpatient. We discussed the possibility of asymptomatic lung damage. We discussed the low likelihood of secondary malignancies. We discussed the possible side effects including but not limited to skin redness, fatigue, permanent skin darkening, and breast swelling.  We discussed the use of cardiac sparing with deep inspiration breath hold if needed.  The patient will be on a trip until November 2. She is scheduled for CT simulation on 10/13/15 at 1 PM and begin treatment the following week. The patient prefers to have her treatments in the afternoon. She must be through with treatment by December 2.  The patient signed a consent form and this was placed in her medical chart.  I provided Nystatin Powder for the patient.  I spent 40 minutes  face to face with the patient and more than 50% of that time was spent in counseling and/or coordination of care.  This document serves as a record of services personally performed by Thea Silversmith, MD. It was created on her behalf by Darcus Austin, a trained medical scribe. The creation of this record is based on the scribe's personal observations and the provider's statements to them. This document has been checked and approved by the attending provider.   ------------------------------------------------  Thea Silversmith, MD

## 2015-09-01 ENCOUNTER — Ambulatory Visit
Admission: RE | Admit: 2015-09-01 | Discharge: 2015-09-01 | Disposition: A | Discharge status: Home / Self Care | Payer: BLUE CROSS/BLUE SHIELD | Source: Ambulatory Visit | Attending: Radiation Oncology | Admitting: Radiation Oncology

## 2015-09-01 ENCOUNTER — Encounter: Payer: Self-pay | Admitting: Radiation Oncology

## 2015-09-01 VITALS — BP 117/75 | HR 70 | Temp 98.7°F | Wt 230.8 lb

## 2015-09-01 DIAGNOSIS — Z170 Estrogen receptor positive status [ER+]: Secondary | ICD-10-CM | POA: Diagnosis not present

## 2015-09-01 DIAGNOSIS — C50412 Malignant neoplasm of upper-outer quadrant of left female breast: Secondary | ICD-10-CM

## 2015-09-01 DIAGNOSIS — C50912 Malignant neoplasm of unspecified site of left female breast: Secondary | ICD-10-CM | POA: Insufficient documentation

## 2015-09-01 DIAGNOSIS — Z510 Encounter for antineoplastic radiation therapy: Secondary | ICD-10-CM | POA: Diagnosis not present

## 2015-09-01 NOTE — Addendum Note (Signed)
Encounter addended by: Norm Salt, RN on: 09/01/2015  2:22 PM<BR>     Documentation filed: Charges VN

## 2015-09-01 NOTE — Addendum Note (Signed)
Encounter addended by: Norm Salt, RN on: 09/01/2015  2:21 PM<BR>     Documentation filed: Arn Medal VN

## 2015-09-02 ENCOUNTER — Ambulatory Visit (HOSPITAL_BASED_OUTPATIENT_CLINIC_OR_DEPARTMENT_OTHER)
Admission: RE | Admit: 2015-09-02 | Discharge: 2015-09-02 | Disposition: A | Discharge status: Home / Self Care | Payer: BLUE CROSS/BLUE SHIELD | Source: Ambulatory Visit | Attending: General Surgery | Admitting: General Surgery

## 2015-09-02 ENCOUNTER — Ambulatory Visit
Admission: RE | Admit: 2015-09-02 | Discharge: 2015-09-02 | Disposition: A | Discharge status: Home / Self Care | Payer: BLUE CROSS/BLUE SHIELD | Source: Ambulatory Visit | Attending: General Surgery | Admitting: General Surgery

## 2015-09-02 ENCOUNTER — Encounter (HOSPITAL_BASED_OUTPATIENT_CLINIC_OR_DEPARTMENT_OTHER)
Admission: RE | Disposition: A | Discharge status: Home / Self Care | Payer: Self-pay | Source: Ambulatory Visit | Attending: General Surgery

## 2015-09-02 ENCOUNTER — Encounter: Payer: Self-pay | Admitting: *Deleted

## 2015-09-02 ENCOUNTER — Encounter (HOSPITAL_BASED_OUTPATIENT_CLINIC_OR_DEPARTMENT_OTHER): Payer: Self-pay | Admitting: Certified Registered"

## 2015-09-02 ENCOUNTER — Ambulatory Visit (HOSPITAL_BASED_OUTPATIENT_CLINIC_OR_DEPARTMENT_OTHER): Payer: BLUE CROSS/BLUE SHIELD | Admitting: Certified Registered"

## 2015-09-02 DIAGNOSIS — D0512 Intraductal carcinoma in situ of left breast: Secondary | ICD-10-CM | POA: Insufficient documentation

## 2015-09-02 DIAGNOSIS — Z87891 Personal history of nicotine dependence: Secondary | ICD-10-CM | POA: Diagnosis not present

## 2015-09-02 DIAGNOSIS — C50412 Malignant neoplasm of upper-outer quadrant of left female breast: Secondary | ICD-10-CM

## 2015-09-02 DIAGNOSIS — M069 Rheumatoid arthritis, unspecified: Secondary | ICD-10-CM | POA: Insufficient documentation

## 2015-09-02 DIAGNOSIS — Z170 Estrogen receptor positive status [ER+]: Secondary | ICD-10-CM | POA: Diagnosis present

## 2015-09-02 DIAGNOSIS — Z803 Family history of malignant neoplasm of breast: Secondary | ICD-10-CM | POA: Insufficient documentation

## 2015-09-02 HISTORY — PX: BREAST LUMPECTOMY WITH RADIOACTIVE SEED LOCALIZATION: SHX6424

## 2015-09-02 SURGERY — BREAST LUMPECTOMY WITH RADIOACTIVE SEED LOCALIZATION
Anesthesia: General | Site: Breast | Laterality: Left

## 2015-09-02 MED ORDER — MIDAZOLAM HCL 2 MG/2ML IJ SOLN
INTRAMUSCULAR | Status: AC
  Filled 2015-09-02: qty 4

## 2015-09-02 MED ORDER — MIDAZOLAM HCL 2 MG/2ML IJ SOLN
1.0000 mg | INTRAMUSCULAR | Status: DC | PRN
  Administered 2015-09-02: 2 mg via INTRAVENOUS

## 2015-09-02 MED ORDER — PROMETHAZINE HCL 25 MG/ML IJ SOLN: 6.2500 mg | INTRAMUSCULAR | Status: DC | PRN

## 2015-09-02 MED ORDER — CEFAZOLIN SODIUM-DEXTROSE 2-3 GM-% IV SOLR
2.0000 g | INTRAVENOUS | Status: AC
  Administered 2015-09-02: 2 g via INTRAVENOUS

## 2015-09-02 MED ORDER — GLYCOPYRROLATE 0.2 MG/ML IJ SOLN: 0.2000 mg | Freq: Once | INTRAMUSCULAR | Status: DC | PRN

## 2015-09-02 MED ORDER — SCOPOLAMINE 1 MG/3DAYS TD PT72: 1.0000 | MEDICATED_PATCH | Freq: Once | TRANSDERMAL | Status: DC | PRN

## 2015-09-02 MED ORDER — FENTANYL CITRATE (PF) 100 MCG/2ML IJ SOLN
50.0000 ug | INTRAMUSCULAR | Status: DC | PRN
  Administered 2015-09-02: 100 ug via INTRAVENOUS
  Administered 2015-09-02: 50 ug via INTRAVENOUS

## 2015-09-02 MED ORDER — CEFAZOLIN SODIUM-DEXTROSE 2-3 GM-% IV SOLR
INTRAVENOUS | Status: AC
  Filled 2015-09-02: qty 50

## 2015-09-02 MED ORDER — HYDROCODONE-ACETAMINOPHEN 5-325 MG PO TABS: 1.0000 | ORAL_TABLET | Freq: Four times a day (QID) | ORAL | Status: DC | PRN

## 2015-09-02 MED ORDER — KETOROLAC TROMETHAMINE 30 MG/ML IJ SOLN
30.0000 mg | Freq: Once | INTRAMUSCULAR | Status: DC
Start: 2015-09-02 — End: 2015-09-02

## 2015-09-02 MED ORDER — FENTANYL CITRATE (PF) 100 MCG/2ML IJ SOLN
INTRAMUSCULAR | Status: AC
  Filled 2015-09-02: qty 4

## 2015-09-02 MED ORDER — BUPIVACAINE-EPINEPHRINE (PF) 0.5% -1:200000 IJ SOLN
INTRAMUSCULAR | Status: DC | PRN
  Administered 2015-09-02: 10 mL

## 2015-09-02 MED ORDER — CHLORHEXIDINE GLUCONATE 4 % EX LIQD: 1.0000 "application " | Freq: Once | CUTANEOUS | Status: DC

## 2015-09-02 MED ORDER — OXYCODONE HCL 5 MG PO TABS: 5.0000 mg | ORAL_TABLET | Freq: Once | ORAL | Status: DC | PRN

## 2015-09-02 MED ORDER — ONDANSETRON HCL 4 MG/2ML IJ SOLN
INTRAMUSCULAR | Status: DC | PRN
  Administered 2015-09-02: 4 mg via INTRAVENOUS

## 2015-09-02 MED ORDER — DEXAMETHASONE SODIUM PHOSPHATE 4 MG/ML IJ SOLN
INTRAMUSCULAR | Status: DC | PRN
  Administered 2015-09-02: 10 mg via INTRAVENOUS

## 2015-09-02 MED ORDER — LIDOCAINE HCL (CARDIAC) 20 MG/ML IV SOLN
INTRAVENOUS | Status: AC
  Filled 2015-09-02: qty 5

## 2015-09-02 MED ORDER — DEXAMETHASONE SODIUM PHOSPHATE 10 MG/ML IJ SOLN
INTRAMUSCULAR | Status: AC
  Filled 2015-09-02: qty 1

## 2015-09-02 MED ORDER — ONDANSETRON HCL 4 MG/2ML IJ SOLN
INTRAMUSCULAR | Status: AC
  Filled 2015-09-02: qty 2

## 2015-09-02 MED ORDER — LIDOCAINE HCL (CARDIAC) 20 MG/ML IV SOLN
INTRAVENOUS | Status: DC | PRN
  Administered 2015-09-02: 60 mg via INTRAVENOUS

## 2015-09-02 MED ORDER — LACTATED RINGERS IV SOLN
INTRAVENOUS | Status: DC
  Administered 2015-09-02: 08:00:00 via INTRAVENOUS

## 2015-09-02 MED ORDER — BUPIVACAINE-EPINEPHRINE (PF) 0.5% -1:200000 IJ SOLN
INTRAMUSCULAR | Status: AC
  Filled 2015-09-02: qty 30

## 2015-09-02 MED ORDER — PROPOFOL 10 MG/ML IV BOLUS
INTRAVENOUS | Status: DC | PRN
  Administered 2015-09-02: 200 mg via INTRAVENOUS
  Administered 2015-09-02: 80 mg via INTRAVENOUS

## 2015-09-02 MED ORDER — OXYCODONE HCL 5 MG/5ML PO SOLN: 5.0000 mg | Freq: Once | ORAL | Status: DC | PRN

## 2015-09-02 MED ORDER — HYDROMORPHONE HCL 1 MG/ML IJ SOLN: 0.2500 mg | INTRAMUSCULAR | Status: DC | PRN

## 2015-09-02 SURGICAL SUPPLY — 60 items
APPLIER CLIP 9.375 MED OPEN (MISCELLANEOUS) ×3
BENZOIN TINCTURE PRP APPL 2/3 (GAUZE/BANDAGES/DRESSINGS) IMPLANT
BINDER BREAST LRG (GAUZE/BANDAGES/DRESSINGS) IMPLANT
BINDER BREAST MEDIUM (GAUZE/BANDAGES/DRESSINGS) IMPLANT
BINDER BREAST XLRG (GAUZE/BANDAGES/DRESSINGS) IMPLANT
BINDER BREAST XXLRG (GAUZE/BANDAGES/DRESSINGS) IMPLANT
BLADE HEX COATED 2.75 (ELECTRODE) ×3 IMPLANT
BLADE SURG 10 STRL SS (BLADE) IMPLANT
BLADE SURG 15 STRL LF DISP TIS (BLADE) ×1 IMPLANT
BLADE SURG 15 STRL SS (BLADE) ×2
CANISTER SUC SOCK COL 7IN (MISCELLANEOUS) IMPLANT
CANISTER SUCT 1200ML W/VALVE (MISCELLANEOUS) ×3 IMPLANT
CHLORAPREP W/TINT 26ML (MISCELLANEOUS) ×3 IMPLANT
CLIP APPLIE 9.375 MED OPEN (MISCELLANEOUS) ×1 IMPLANT
CLOSURE WOUND 1/2 X4 (GAUZE/BANDAGES/DRESSINGS)
COVER BACK TABLE 60X90IN (DRAPES) ×3 IMPLANT
COVER MAYO STAND STRL (DRAPES) ×3 IMPLANT
COVER PROBE W GEL 5X96 (DRAPES) ×3 IMPLANT
DECANTER SPIKE VIAL GLASS SM (MISCELLANEOUS) IMPLANT
DERMABOND ADVANCED (GAUZE/BANDAGES/DRESSINGS) ×2
DERMABOND ADVANCED .7 DNX12 (GAUZE/BANDAGES/DRESSINGS) ×1 IMPLANT
DEVICE DUBIN W/COMP PLATE 8390 (MISCELLANEOUS) ×3 IMPLANT
DRAPE LAPAROSCOPIC ABDOMINAL (DRAPES) ×3 IMPLANT
DRAPE UTILITY XL STRL (DRAPES) ×3 IMPLANT
DRSG PAD ABDOMINAL 8X10 ST (GAUZE/BANDAGES/DRESSINGS) IMPLANT
ELECT REM PT RETURN 9FT ADLT (ELECTROSURGICAL) ×3
ELECTRODE REM PT RTRN 9FT ADLT (ELECTROSURGICAL) ×1 IMPLANT
GLOVE BIOGEL PI IND STRL 7.0 (GLOVE) ×2 IMPLANT
GLOVE BIOGEL PI INDICATOR 7.0 (GLOVE) ×4
GLOVE ECLIPSE 6.5 STRL STRAW (GLOVE) ×3 IMPLANT
GLOVE EUDERMIC 7 POWDERFREE (GLOVE) ×3 IMPLANT
GOWN STRL REUS W/ TWL LRG LVL3 (GOWN DISPOSABLE) ×1 IMPLANT
GOWN STRL REUS W/ TWL XL LVL3 (GOWN DISPOSABLE) ×1 IMPLANT
GOWN STRL REUS W/TWL LRG LVL3 (GOWN DISPOSABLE) ×2
GOWN STRL REUS W/TWL XL LVL3 (GOWN DISPOSABLE) ×2
KIT MARKER MARGIN INK (KITS) ×3 IMPLANT
NEEDLE HYPO 25X1 1.5 SAFETY (NEEDLE) ×3 IMPLANT
NS IRRIG 1000ML POUR BTL (IV SOLUTION) ×3 IMPLANT
PACK BASIN DAY SURGERY FS (CUSTOM PROCEDURE TRAY) ×3 IMPLANT
PENCIL BUTTON HOLSTER BLD 10FT (ELECTRODE) ×3 IMPLANT
SHEET MEDIUM DRAPE 40X70 STRL (DRAPES) IMPLANT
SLEEVE SCD COMPRESS KNEE MED (MISCELLANEOUS) ×3 IMPLANT
SPONGE GAUZE 4X4 12PLY STER LF (GAUZE/BANDAGES/DRESSINGS) IMPLANT
SPONGE LAP 18X18 X RAY DECT (DISPOSABLE) IMPLANT
SPONGE LAP 4X18 X RAY DECT (DISPOSABLE) ×3 IMPLANT
STRIP CLOSURE SKIN 1/2X4 (GAUZE/BANDAGES/DRESSINGS) IMPLANT
SUT ETHILON 3 0 FSL (SUTURE) IMPLANT
SUT MNCRL AB 4-0 PS2 18 (SUTURE) ×3 IMPLANT
SUT SILK 2 0 SH (SUTURE) ×3 IMPLANT
SUT VIC AB 2-0 CT1 27 (SUTURE)
SUT VIC AB 2-0 CT1 TAPERPNT 27 (SUTURE) IMPLANT
SUT VIC AB 3-0 SH 27 (SUTURE)
SUT VIC AB 3-0 SH 27X BRD (SUTURE) IMPLANT
SUT VICRYL 3-0 CR8 SH (SUTURE) ×3 IMPLANT
SYRINGE 10CC LL (SYRINGE) ×3 IMPLANT
TOWEL OR 17X24 6PK STRL BLUE (TOWEL DISPOSABLE) ×3 IMPLANT
TOWEL OR NON WOVEN STRL DISP B (DISPOSABLE) IMPLANT
TUBE CONNECTING 20'X1/4 (TUBING) ×1
TUBE CONNECTING 20X1/4 (TUBING) ×2 IMPLANT
YANKAUER SUCT BULB TIP NO VENT (SUCTIONS) ×3 IMPLANT

## 2015-09-02 NOTE — Discharge Instructions (Signed)
[facility] °Office Phone Number [phone] ° °BREAST BIOPSY/ PARTIAL MASTECTOMY: POST OP INSTRUCTIONS ° °Always review your discharge instruction sheet given to you by the facility where your surgery was performed. ° °IF YOU HAVE DISABILITY OR FAMILY LEAVE FORMS, YOU MUST BRING THEM TO THE OFFICE FOR PROCESSING.  DO NOT GIVE THEM TO YOUR DOCTOR. ° °1. A prescription for pain medication may be given to you upon discharge.  Take your pain medication as prescribed, if needed.  If narcotic pain medicine is not needed, then you may take acetaminophen (Tylenol) or ibuprofen (Advil) as needed. °2. Take your usually prescribed medications unless otherwise directed °3. If you need a refill on your pain medication, please contact your pharmacy.  They will contact our office to request authorization.  Prescriptions will not be filled after 5pm or on week-ends. °4. You should eat very light the first 24 hours after surgery, such as soup, crackers, pudding, etc.  Resume your normal diet the day after surgery. °5. Most patients will experience some swelling and bruising in the breast.  Ice packs and a good support bra will help.  Swelling and bruising can take several days to resolve.  °6. It is common to experience some constipation if taking pain medication after surgery.  Increasing fluid intake and taking a stool softener will usually help or prevent this problem from occurring.  A mild laxative (Milk of Magnesia or Miralax) should be taken according to package directions if there are no bowel movements after 48 hours. °7. Unless discharge instructions indicate otherwise, you may remove your bandages 24-48 hours after surgery, and you may shower at that time.  You may have steri-strips (small skin tapes) in place directly over the incision.  These strips should be left on the skin for 7-10 days.  If your surgeon used skin glue on the incision, you may shower in 24 hours.  The glue will flake off over the  next 2-3 weeks.  Any sutures or staples will be removed at the office during your follow-up visit. °8. ACTIVITIES:  You may resume regular daily activities (gradually increasing) beginning the next day.  Wearing a good support bra or sports bra minimizes pain and swelling.  You may have sexual intercourse when it is comfortable. °a. You may drive when you no longer are taking prescription pain medication, you can comfortably wear a seatbelt, and you can safely maneuver your car and apply brakes. °b. RETURN TO WORK:  ______________________________________________________________________________________ °9. You should see your doctor in the office for a follow-up appointment approximately two weeks after your surgery.  Your doctor’s nurse will typically make your follow-up appointment when she calls you with your pathology report.  Expect your pathology report 2-3 business days after your surgery.  You may call to check if you do not hear from us after three days. °10. OTHER INSTRUCTIONS: _______________________________________________________________________________________________ _____________________________________________________________________________________________________________________________________ °_____________________________________________________________________________________________________________________________________ °_____________________________________________________________________________________________________________________________________ ° °WHEN TO CALL YOUR DOCTOR: °1. Fever over 101.0 °2. Nausea and/or vomiting. °3. Extreme swelling or bruising. °4. Continued bleeding from incision. °5. Increased pain, redness, or drainage from the incision. ° °The clinic staff is available to answer your questions during regular business hours.  Please don’t hesitate to call and ask to speak to one of the nurses for clinical concerns.  If you have a medical emergency, go to the nearest  emergency room or call 911.  A surgeon from [facility] is always on call at the hospital. ° °For further questions, please visit [email or web address]  ° ° ° °  Post Anesthesia Home Care Instructions ° °Activity: °Get plenty of rest for the remainder of the day. A responsible adult should stay with you for 24 hours following the procedure.  °For the next 24 hours, DO NOT: °-Drive a car °-Operate machinery °-Drink alcoholic beverages °-Take any medication unless instructed by your physician °-Make any legal decisions or sign important papers. ° °Meals: °Start with liquid foods such as gelatin or soup. Progress to regular foods as tolerated. Avoid greasy, spicy, heavy foods. If nausea and/or vomiting occur, drink only clear liquids until the nausea and/or vomiting subsides. Call your physician if vomiting continues. ° °Special Instructions/Symptoms: °Your throat may feel dry or sore from the anesthesia or the breathing tube placed in your throat during surgery. If this causes discomfort, gargle with warm salt water. The discomfort should disappear within 24 hours. ° °If you had a scopolamine patch placed behind your ear for the management of post- operative nausea and/or vomiting: ° °1. The medication in the patch is effective for 72 hours, after which it should be removed.  Wrap patch in a tissue and discard in the trash. Wash hands thoroughly with soap and water. °2. You may remove the patch earlier than 72 hours if you experience unpleasant side effects which may include dry mouth, dizziness or visual disturbances. °3. Avoid touching the patch. Wash your hands with soap and water after contact with the patch. °  ° °

## 2015-09-02 NOTE — Anesthesia Preprocedure Evaluation (Addendum)
Anesthesia Evaluation  Patient identified by MRN, date of birth, ID band Patient awake    Reviewed: Allergy & Precautions, NPO status , Patient's Chart, lab work & pertinent test results  Airway Mallampati: III  TM Distance: >3 FB Neck ROM: Full    Dental   Pulmonary former smoker,    breath sounds clear to auscultation       Cardiovascular negative cardio ROS   Rhythm:Regular Rate:Normal     Neuro/Psych negative neurological ROS     GI/Hepatic negative GI ROS, Neg liver ROS,   Endo/Other  negative endocrine ROS  Renal/GU negative Renal ROS     Musculoskeletal  (+) Arthritis , Rheumatoid disorders,    Abdominal   Peds  Hematology negative hematology ROS (+)   Anesthesia Other Findings   Reproductive/Obstetrics                            Anesthesia Physical Anesthesia Plan  ASA: II  Anesthesia Plan: General   Post-op Pain Management:    Induction: Intravenous  Airway Management Planned: LMA  Additional Equipment:   Intra-op Plan:   Post-operative Plan:   Informed Consent: I have reviewed the patients History and Physical, chart, labs and discussed the procedure including the risks, benefits and alternatives for the proposed anesthesia with the patient or authorized representative who has indicated his/her understanding and acceptance.     Plan Discussed with: CRNA  Anesthesia Plan Comments:         Anesthesia Quick Evaluation

## 2015-09-02 NOTE — Interval H&P Note (Signed)
History and Physical Interval Note:  09/02/2015 8:00 AM  Kim Porter  has presented today for surgery, with the diagnosis of LEFT BREAST DCIS  The various methods of treatment have been discussed with the patient and family. After consideration of risks, benefits and other options for treatment, the patient has consented to  Procedure(s): LEFT BREAST LUMPECTOMY WITH RADIOACTIVE SEED LOCALIZATION (Left) as a surgical intervention .  The patient's history has been reviewed, patient examined, no change in status, stable for surgery.  I have reviewed the patient's chart and labs.  Questions were answered to the patient's satisfaction.     Adin Hector

## 2015-09-02 NOTE — Progress Notes (Signed)
[place] Psychosocial Distress Screening Clinical Social Work  Clinical Social Work was referred by distress screening protocol.  The patient scored a 5 on the Psychosocial Distress Thermometer which indicates moderate distress. Clinical Social Worker phoned pt to assess for distress and other psychosocial needs. Pt feels less anxious after her surgery. She is very relieved that it is over and went well. CSW reviewed role of CSW and discussed resources/support programs to assist as needed. Pt agrees to reach out as needs arise.   ONCBCN DISTRESS SCREENING 09/01/2015  Screening Type Initial Screening  Distress experienced in past week (1-10) 5  Emotional problem type Nervousness/Anxiety    Clinical Social Worker follow up needed: No.  If yes, follow up plan:  Loren Racer, [place]  [facility] Phone: [phone] Fax: [phone]

## 2015-09-02 NOTE — Transfer of Care (Signed)
Immediate Anesthesia Transfer of Care Note  Patient: Kim Porter  Procedure(s) Performed: Procedure(s): LEFT BREAST LUMPECTOMY WITH RADIOACTIVE SEED LOCALIZATION (Left)  Patient Location: PACU  Anesthesia Type:General  Level of Consciousness: awake, alert  and oriented  Airway & Oxygen Therapy: Patient Spontanous Breathing and Patient connected to face mask oxygen  Post-op Assessment: Report given to RN, Post -op Vital signs reviewed and stable and Patient moving all extremities  Post vital signs: Reviewed and stable  Last Vitals:  Filed Vitals:   09/02/15 0959  BP:   Pulse: 94  Temp:   Resp:     Complications: No apparent anesthesia complications

## 2015-09-02 NOTE — Anesthesia Postprocedure Evaluation (Signed)
  Anesthesia Post-op Note  Patient: Kim Porter  Procedure(s) Performed: Procedure(s): LEFT BREAST LUMPECTOMY WITH RADIOACTIVE SEED LOCALIZATION (Left)  Patient Location: PACU  Anesthesia Type:General  Level of Consciousness: awake, alert  and oriented  Airway and Oxygen Therapy: Patient Spontanous Breathing  Post-op Pain: mild  Post-op Assessment: Post-op Vital signs reviewed              Post-op Vital Signs: Reviewed  Last Vitals:  Filed Vitals:   09/02/15 1041  BP: 139/74  Pulse: 76  Temp: 36.7 C  Resp: 16    Complications: No apparent anesthesia complications

## 2015-09-02 NOTE — Anesthesia Procedure Notes (Signed)
Procedure Name: LMA Insertion Date/Time: 09/02/2015 9:00 AM Performed by: Baxter Flattery Pre-anesthesia Checklist: Patient identified, Emergency Drugs available, Suction available and Patient being monitored Patient Re-evaluated:Patient Re-evaluated prior to inductionOxygen Delivery Method: Circle System Utilized Preoxygenation: Pre-oxygenation with 100% oxygen Intubation Type: IV induction Ventilation: Mask ventilation without difficulty LMA: LMA inserted LMA Size: 4.0 Number of attempts: 1 Airway Equipment and Method: Bite block Placement Confirmation: positive ETCO2 and breath sounds checked- equal and bilateral Tube secured with: Tape Dental Injury: Teeth and Oropharynx as per pre-operative assessment

## 2015-09-02 NOTE — Op Note (Signed)
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 Dalbert Batman, M.D., FACS  Assistant:                      OR staff  Operative Indications:   The patient is a 49 year old female who presents with breast cancer. She is here for final discussion before her definitive left breast cancer surgery. Her oncologist is Dr. Jana Hakim. Her radiation oncologist is Dr. Pablo Ledger. Dr. Jefm Petty her PCP. Dr. Stann Mainland is her gynecologist. Dr. Estanislado Pandy is her rheumatologist.   Initially she was evaluated on August 11, 2015. Imaging studies showed a new focal cluster of calcifications in the left breast, upper outer quadrant, only 1 cm in diameter, posterior depth. Image guided biopsy showed high-grade DCIS, hormone receptor positive. She has a strong family history for breast cancer in mother, 2 maternal aunts, and 1 cousin who is reportedly genetic testing negative. The patient is strongly motivated for breast conservation.   She has seen Dr. Jana Hakim. She has had genetic testing drawn but it is not out yet. She is scheduled for left breast lumpectomy with radioactive seed localization on 09/02/2015.Marland Kitchen   Her MRI showed abnormal enhancement in the upper outer quadrant of the left breast posteriorly. This was larger, 4.1 x 1.9 x 2.9 cm. I told her that this may or may not represent the extent of her cancer. I told her this was a solitary finding. She knows that this slightly increases her risk of a positive margin and second surgery but hopefully will try to get all of this out on the initial surgery. I discussed the indications, details, techniques, and numerous risk of the surgery with her. She is aware of the risk  of bleeding, infection, reoperation for positive margins, possibility of invasive cancer necessitating left eye surgery in the future, cosmetic deformity, nerve damage and chronic pain, and other unforeseen problems. She knows that she will need radiation therapy, and she is going to see Dr. Pablo Ledger this Thursday. She understands these issues well. This time her questions were answered. She agrees with this plan.    In terms of her rheumatoid arthritis, she has already stopped her Humira and methotrexate and folic acid. I told her that she could probably restart her medications one month postoperative if everything seemed to be healing well. I told her to stop her facial.  Operative Findings:       The radioactive seed and marker clip are in the very lateral upper outer quadrant, somewhat more posterior.  I took a generous lumpectomy specimen.  The posterior margin was widely at the muscle.  The pectoralis major fascia was removed as part of the posterior margin.  The specimen mammogram looked good with the marker clip and the seed immediately adjacent to each other and in the dead center of the specimen  Procedure in Detail:         Radioactive seed was placed by  radiology earlier this week.  In the holding area I used the neoprobe and could hear the radioactive signal in the upper outer left breast.  The patient was taken to the operating room.  General anesthesia with LMA was induced.  Intravenous antibiotics were given.  Surgical timeout was performed.  Left breast was prepped and draped in a sterile fashion.  0.5% Marcaine with epinephrine was used as local infiltration.       Using the neoprobe probe I planned my incision which was very lateral in the upper outer quadrant.  The incision was made in a curvilinear fashion.  Using the neoprobe I dissected down into the breast tissue widely around the radioactive signal all the way to the pectoralis major muscle.  I identified the lateral  border of the pectoralis muscle and the posterior margin included the muscle fascia and some of the axilla.  The specimen was removed and marked with silk sutures and a 6 color ink kit to orient the pathologist.  Specimen mammogram was performed and looked good as described above.        The wound was irrigated with saline.  Hemostasis was excellent.  The lumpectomy cavity was marked with 5 or 6 metal clips to orient the radiation oncologist.  The breast tissues were carefully closed with multiple interrupted sutures of 3-0 Vicryl and the skin closed with a running subcuticular 4-0 Monocryl and Dermabond.  Breast binder was placed the patient taken to PACU in stable condition.  EBL 15 mL.  Counts correct.  Complications none.     Edsel Petrin. Dalbert Batman, M.D., FACS General and Minimally Invasive Surgery Breast and Colorectal Surgery  09/02/2015 10:00 AM

## 2015-09-05 ENCOUNTER — Ambulatory Visit: Payer: Self-pay | Admitting: Genetic Counselor

## 2015-09-05 ENCOUNTER — Encounter (HOSPITAL_BASED_OUTPATIENT_CLINIC_OR_DEPARTMENT_OTHER): Payer: Self-pay | Admitting: General Surgery

## 2015-09-05 ENCOUNTER — Telehealth: Payer: Self-pay | Admitting: Genetic Counselor

## 2015-09-05 DIAGNOSIS — Z1379 Encounter for other screening for genetic and chromosomal anomalies: Secondary | ICD-10-CM

## 2015-09-05 NOTE — Telephone Encounter (Signed)
Discussed with Kim Porter that her genetic test results were negative for pathogenic mutations within any of 20 genes that would cause her to be at increased risk for breast, ovarian, or other related cancers.  Additionally, no uncertain changes were found.  We still do not have an explanation for the personal or family history of breast cancer.  It could be that all of the breast cancer in the family was caused by chance; it could be that there is a hereditary cancer syndrome in the family that Kim Porter herself did not inherit; it could also be that maybe we are just not testing the correct genes at this point in time; or it could be that there is some kind of familial predisposition to breast cancer in the family that is not caused by a single genetic mutation.  Kim Porter mother could have genetic testing if interested.  We also discussed that Kim Porter's sister could qualify for annual breast MRI in addition to annual mammogram, since the Tyrer-Cuzick risk model calculated her lifetime breast cancer risk to be approximately 24.8%.  Women in the family are still considered to be at a somewhat increased risk for breast cancer based on the history.  Thus, women who have not yet begun their annual mammogram screening, including Kim Porter, may do so beginning a bit earlier--at the age of 46 based on Kim Porter's diagnosis at 50.  Kim Porter and her family members are welcome to call me with any further questions.

## 2015-09-06 DIAGNOSIS — Z1379 Encounter for other screening for genetic and chromosomal anomalies: Secondary | ICD-10-CM | POA: Insufficient documentation

## 2015-09-06 NOTE — Progress Notes (Signed)
GENETIC TEST RESULTS  HPI: Ms. Ahart was previously seen in the [facility] due to a personal history of breast cancer at 4, family history of breast and unspecified cancers, and concerns regarding a hereditary predisposition to cancer. Please refer to our prior cancer genetics clinic note from August 25, 2015, for more information regarding Ms. Life's medical, social and family histories, and our assessment and recommendations, at the time. Ms. Woolverton recent genetic test results were disclosed to her, as were recommendations warranted by these results. These results and recommendations are discussed in more detail below.  GENETIC TEST RESULTS: At the time of Ms. Musil's visit on 08/25/15, we recommended she pursue genetic testing of the 20-gene Breast/Ovarian Cancer Panel through [facility] Hope Pigeon, MD).  The Breast/Ovarian Cancer Panel offered by [facility] includes sequencing and deletion/duplication analysis for the following 19 genes:  ATM, BARD1, BRCA1, BRCA2, BRIP1, CDH1, CHEK2, FANCC, MLH1, MSH2, MSH6, NBN, PALB2, PMS2, PTEN, RAD51C, RAD51D, TP53, and XRCC2.  This panel also includes deletion/duplication analysis (without sequencing) for one gene, EPCAM.  Those results are now back, the report date for which is September 05, 2015.  Genetic testing was normal, and did not reveal a deleterious mutation in these genes.  Additionally no variants of uncertain significance (VUSs) The test report will be scanned into EPIC and will be located under the Results Review tab in the Pathology>Molecular Pathology section.   We discussed with Ms. Kroening that since the current genetic testing is not perfect, it is possible there may be a gene mutation in one of these genes that current testing cannot detect, but that chance is small. We also discussed, that it is possible that another gene that has not yet been discovered, or that we have not yet tested, is responsible for the  cancer diagnoses in the family, and it is, therefore, important to remain in touch with cancer genetics in the future so that we can continue to offer Ms. Heiden the most up to date genetic testing.     CANCER SCREENING RECOMMENDATIONS: We still do not have an explanation for the personal and family history of cancer.  It could be that this result is reassuring and indicates that Ms. Herendeen likely does not have an increased risk for a future cancer due to a mutation in one of these genes.  However, several women in the maternal family have had breast cancer, although most were diagnosed in their 35s and 5s.  It could also be the case that there may be a hereditary cancer syndrome in the family that she herself did not inherit.  There may also be a familial predisposition to breast cancer, particularly later-onset breast cancer, that is not caused by a single genetic mutation.  Most cancers happen by chance and this negative test suggests that her cancer falls into this category.  Thus, we recommended she continue to follow the cancer management and screening guidelines provided by her oncology and primary healthcare providers.    RECOMMENDATIONS FOR FAMILY MEMBERS: Women in this family might be at some increased risk of developing cancer, over the general population risk, simply due to the family history of cancer. We recommended women in this family have a yearly mammogram beginning at age 73, or 72 years younger than the earliest onset of cancer, an an annual clinical breast exam, and perform monthly breast self-exams. Women in this family should also have a gynecological exam as recommended by their primary provider. All family members should  have a colonoscopy by age 42.  Thus, Ms. Nish female relatives, including her niece, could begin annual mammogram screening at the age of 73, based on her diagnosis at the age of 44. We also discussed that Ms. Gaffey's sister could potentially qualify for annual  breast MRI in addition to annual mammogram, since the Tyrer-Cuzick risk model calculated her lifetime breast cancer risk to be approximately 24.8%.   Based on Ms. Armond's family history, we recommended her mother, who was diagnosed with breast cancer at age 63, have genetic counseling and testing.  Ms. Shave sister would also be eligible for genetic counseling and testing, if interested.  Ms. Gilkerson will let us know if we can be of any assistance in coordinating genetic counseling and/or testing for these family members.   FOLLOW-UP: Lastly, we discussed with Ms. Sherrow that cancer genetics is a rapidly advancing field and it is possible that new genetic tests will be appropriate for her and/or her family members in the future. We encouraged her to remain in contact with cancer genetics on an annual basis so we can update her personal and family histories and let her know of advances in cancer genetics that may benefit this family.   Our contact number was provided. Ms. Lovins questions were answered to her satisfaction, and she knows she is welcome to call us at anytime with additional questions or concerns.   Jeanine Luz, MS Genetic Counselor [email or web address] Phone: [phone]

## 2015-09-12 ENCOUNTER — Ambulatory Visit (HOSPITAL_BASED_OUTPATIENT_CLINIC_OR_DEPARTMENT_OTHER): Payer: BLUE CROSS/BLUE SHIELD | Admitting: Oncology

## 2015-09-12 ENCOUNTER — Telehealth: Payer: Self-pay | Admitting: Oncology

## 2015-09-12 VITALS — BP 134/73 | HR 80 | Temp 98.9°F | Resp 18 | Ht 70.0 in | Wt 232.6 lb

## 2015-09-12 DIAGNOSIS — D0512 Intraductal carcinoma in situ of left breast: Secondary | ICD-10-CM

## 2015-09-12 DIAGNOSIS — Z170 Estrogen receptor positive status [ER+]: Secondary | ICD-10-CM | POA: Diagnosis not present

## 2015-09-12 DIAGNOSIS — C50412 Malignant neoplasm of upper-outer quadrant of left female breast: Secondary | ICD-10-CM

## 2015-09-12 NOTE — Progress Notes (Signed)
[place]  [phone] Fax:(336) [phone]     ID: Sanoe Hazan DOB: 21-Mar-1966  MR#: [id number]  JAS#:[id number]  Patient Care Team: Jefm Petty, MD as PCP - General (Family Medicine) Vania Rea, MD as Consulting Physician (Obstetrics and Gynecology) Chauncey Cruel, MD as Consulting Physician (Oncology) Fanny Skates, MD as Consulting Physician (General Surgery) PCP: Wonda Cerise, MD OTHER MD: Dyke Brackett MD, Nena Polio MD, Thea Silversmith M.D.  CHIEF COMPLAINT: ductal carcinoma in situ  CURRENT TREATMENT: awaiting adjuvant radiation   BREAST CANCER HISTORY: From the original intake note:  Kim Porter had bilateral screening mammography with tomography 07/11/2015 showing calcifications in the left breast, requiring further imaging.on 07/15/2015 she underwent a diagnostic left mammography which showed the breast density to be category C. 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  Her subsequent history is as detailed below.  INTERVAL HISTORY:  Kim Porter returns today for follow-up of her noninvasive breast cancer. Since her last visit here she underwent left lumpectomy, on 09/02/2015. This showed ([id number] [phone]) high-grade ductal carcinoma in situ. Margins were negative and ample. The prognostic panel was not repeated. She also underwent genetic testing, which showed no deleterious mutations and no variant of uncertain significance.  REVIEW OF SYSTEMS: She did well with the surgery, without unusual fever, bleeding or pain. She actually never took narcotics. She went back to work within a week. She felt a little extra tired this week and  by today she felt well again and is back to work. A  detailed review of systems today was otherwise entirely negative  PAST MEDICAL HISTORY: Past Medical History  Diagnosis Date  . Breast cancer of upper-outer quadrant of left female breast 08/12/2015  . Rheumatoid arthritis     PAST SURGICAL HISTORY: Past Surgical History  Procedure Laterality Date  . Eye surgery    . Lasix surgery bilateral     . Breast lumpectomy with radioactive seed localization Left 09/02/2015    Procedure: LEFT BREAST LUMPECTOMY WITH RADIOACTIVE SEED LOCALIZATION;  Surgeon: Fanny Skates, MD;  Location: [facility];  Service: General;  Laterality: Left;    FAMILY HISTORY Family History  Problem Relation Age of Onset  . Breast cancer Mother 14  . Colon polyps Mother 44  . Other Sister     has had "7 lumpectomies, all benign"  . Diabetes Sister   . Cancer Maternal Grandmother     unknown primary - metastasized  . Emphysema Maternal Grandfather   . Cancer Maternal Uncle 61    unspecified type; "caused by agent orange"  . Breast cancer Other     (x2); dx. 17 and 75  . Breast cancer Cousin 66    multiple primaries; negative genetic testing   . Alzheimer's disease Other     the patient's parents are still living, in their late 42s. The patient's mother was diagnosed with breast cancer the age of 5. The maternal grandmother may have had breast cancer diagnosed at age 57. 46 of the patient's mother's maternal aunts were diagnosed with breast cancer at the ages of 50 and 37. The patient also has a second cousin diagnosed with breast cancer at age 59. This cousin has been tested for a mutation  ( extensive panel) and no mutation was found  GYNECOLOGIC HISTORY:  Patient's last menstrual period was 08/22/2015.  Menarche age 32 , the patient is GX P0. She is still having regular periods. 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 Her husband, Liliane Channel, is a Copy. They have land and keep approximately 75 horses, many of them theirs or otherwise boarded. They also have 3 dogs and one cat.    ADVANCED DIRECTIVES:  Not in place   HEALTH MAINTENANCE: Social History  Substance Use Topics  . Smoking status: Former Smoker    Types: Cigarettes  . Smokeless tobacco: Never Used     Comment: 1 ppwk for 4 yrs in college  . Alcohol Use: 0.6 oz/week    1 Cans of beer per week     Comment: Drinks 1-2 beers a week     Colonoscopy:  PAP:  Bone density:  Lipid panel:  No Known Allergies  Current Outpatient Prescriptions  Medication Sig Dispense Refill  . Adalimumab (HUMIRA PEN [place]) Inject into the skin.    Marland Kitchen FOLIC ACID PO Take by mouth.    Marland Kitchen HYDROcodone-acetaminophen (NORCO) 5-325 MG per tablet Take 1-2 tablets by mouth every 6 (six) hours as needed for moderate pain or severe pain. 30 tablet 0  . methotrexate (50 MG/ML) 1 G injection Inject into the vein once. Takes 0.8 mls SQ once a week    . Omega-3 Fatty Acids (FISH OIL PO) Take 1,200 mg by mouth daily.     No current facility-administered medications for this visit.    OBJECTIVE:  Middle-aged white woman In no acute distress Filed Vitals:   09/12/15 1505  BP: 134/73  Pulse: 80  Temp: 98.9 F (37.2 C)  Resp: 18     Body mass index is 33.37 kg/(m^2).    ECOG FS:0 - Asymptomatic  Sclerae unicteric, pupils round and equal Oropharynx clear and moist-- no thrush or other lesions No cervical or supraclavicular adenopathy Lungs no rales or rhonchi Heart regular rate and rhythm Abd soft, nontender, positive bowel sounds MSK no focal spinal tenderness, no upper extremity lymphedema Neuro: nonfocal, well oriented, appropriate affect Breasts: the right breast is unremarkable. The left breast status post lumpectomy. The cosmetic result is excellent. There is no swelling, erythema, or dehiscence. Left axilla is benign.  LAB RESULTS:  CMP     Component Value Date/Time   NA 139  08/16/2015 1620   K 3.6 08/16/2015 1620   CO2 23 08/16/2015 1620   GLUCOSE 104 08/16/2015 1620   BUN 11.2 08/16/2015 1620   CREATININE 0.8 08/16/2015 1620   CALCIUM 9.5 08/16/2015 1620   PROT 7.5 08/16/2015 1620   ALBUMIN 4.2 08/16/2015 1620   AST 18 08/16/2015 1620   ALT 25 08/16/2015 1620   ALKPHOS 53 08/16/2015 1620   BILITOT 0.30 08/16/2015 1620    INo results found for: SPEP, UPEP  Lab Results  Component Value Date   WBC 10.7* 08/16/2015   NEUTROABS 5.9 08/16/2015   HGB 13.5 08/16/2015   HCT 39.9 08/16/2015   MCV 97.3 08/16/2015   PLT 272 08/16/2015      Chemistry      Component Value Date/Time   NA 139 08/16/2015 1620   K 3.6 08/16/2015 1620   CO2 23 08/16/2015 1620   BUN 11.2 08/16/2015 1620   CREATININE 0.8 08/16/2015 1620      Component Value Date/Time   CALCIUM 9.5 08/16/2015 1620  ALKPHOS 53 08/16/2015 1620   AST 18 08/16/2015 1620   ALT 25 08/16/2015 1620   BILITOT 0.30 08/16/2015 1620       No results found for: LABCA2  No components found for: [id number]  No results for input(s): INR in the last 168 hours.  Urinalysis No results found for: COLORURINE, APPEARANCEUR, LABSPEC, PHURINE, GLUCOSEU, HGBUR, BILIRUBINUR, KETONESUR, PROTEINUR, UROBILINOGEN, NITRITE, LEUKOCYTESUR  STUDIES: Mr Breast Bilateral W Wo Contrast  08/19/2015   CLINICAL DATA:  Recent diagnosis of left breast cancer.  LABS:  Does not apply  EXAM: BILATERAL BREAST MRI WITH AND WITHOUT CONTRAST  TECHNIQUE: Multiplanar, multisequence MR images of both breasts were obtained prior to and following the intravenous administration of 20 ml of MultiHance.  THREE-DIMENSIONAL MR IMAGE RENDERING ON INDEPENDENT WORKSTATION:  Three-dimensional MR images were rendered by post-processing of the original MR data on an independent workstation. The three-dimensional MR images were interpreted, and findings are reported in the following complete MRI report for this study. Three dimensional images were  evaluated at the independent DynaCad workstation  COMPARISON:  Previous exam(s).  FINDINGS: Breast composition: c. Heterogeneous fibroglandular tissue.  Background parenchymal enhancement: Moderate.  Right breast: No mass or abnormal enhancement.  Left breast: There is a 4.1 x 1.9 x 2.9 cm enhancement with washout kinetics with associated biopsy clip artifact in the posterior upper-outer quadrant left breast.  Lymph nodes: No abnormal appearing lymph nodes.  Ancillary findings:  None.  IMPRESSION: Abnormal enhancement in the posterior upper-outer quadrant left breast consistent with patient's known cancer.  RECOMMENDATION: Treatment plan.  BI-RADS CATEGORY  6: Known biopsy-proven malignancy.   Electronically Signed   By: Abelardo Diesel M.D.   On: 08/19/2015 10:05   Mm Breast Surgical Specimen  09/02/2015   CLINICAL DATA:  Post excision, left breast  EXAM: SPECIMEN RADIOGRAPH OF THE left BREAST  COMPARISON:  Prior exams  FINDINGS: Status post excision of the left breast. The radioactive seed and biopsy marker clip are present, completely intact, and were marked for pathology. Findings discussed with operating staff Almyra Free, RN, by Dr. Nyoka Cowden at 9:36 a.m.  IMPRESSION: Specimen radiograph of the left breast.   Electronically Signed   By: Conchita Paris M.D.   On: 09/02/2015 09:37   Mm Lt Radioactive Seed Loc Mammo Guide  08/30/2015   CLINICAL DATA:  49 year old female with recently diagnosed high-grade ductal carcinoma in situ of the left breast following stereotactic guided biopsy of calcifications on 08/05/2015. Patient now presents for a preoperative radioactive seed localization of the left breast.  EXAM: MAMMOGRAPHIC GUIDED RADIOACTIVE SEED LOCALIZATION OF THE LEFT BREAST  COMPARISON:  Previous exam(s).  FINDINGS: Patient presents for radioactive seed localization prior to left breast lumpectomy. I met with the patient and we discussed the procedure of seed localization including benefits and alternatives. We  discussed the high likelihood of a successful procedure. We discussed the risks of the procedure including infection, bleeding, tissue injury and further surgery. We discussed the low dose of radioactivity involved in the procedure. Informed, written consent was given.  The usual time-out protocol was performed immediately prior to the procedure.  Using mammographic guidance, sterile technique, 2% lidocaine and an I-125 radioactive seed, the coil shaped biopsy marking clip with associated calcifications was localized using a lateral to medial approach. The follow-up mammogram images confirm the seed in the expected location and were marked for Dr. Dalbert Batman.  Follow-up survey of the patient confirms presence of the radioactive seed.  Order number of I-125 seed:  [id number].  Total activity:  0.268 mCi  Reference Date: 08/19/2015  The patient tolerated the procedure well and was released from the [facility]. She was given instructions regarding seed removal.  IMPRESSION: Radioactive seed localization left breast. No apparent complications.   Electronically Signed   By: Everlean Alstrom M.D.   On: 08/30/2015 16:13    ASSESSMENT: 49 y.o. [place] woman status post left breast upper outer quadrant biopsy 08/05/2015 for ductal carcinoma in situ, high-grade, estrogen and progesterone receptor positive   (1) genetics testing 09/05/2015 through the Breast/Ovarian Cancer Panel offered by [facility] found no deleterious mutations or variants of uncertain significance in ATM, BARD1, BRCA1, BRCA2, BRIP1, CDH1, CHEK2, FANCC, MLH1, MSH2, MSH6, NBN, PALB2, PMS2, PTEN, RAD51C, RAD51D, TP53, and XRCC2.  This panel also includes deletion/duplication analysis (without sequencing) for one gene, EPCAM.    (2) left lumpectomy 09/02/2015 showed residual high-grade ductal carcinoma in situ, with negative and ample margins  (3) adjuvant radiation to follow surgery   (4) anti-estrogens to follow radiation  PLAN: Tylyn did well with  her surgery and once she comes back from the horse show in [place] she is scheduled for simulation the first week in November.  I think she should go back on her methotrexate now. I checked with radiation oncology and they do want to stop it, even though she takes such low doses, because of the risk of sensitization. Her last dose accordingly will be October 24. She will not take it on October 31. She will then resume the methotrexate one or 2 weeks after completing radiation.  She will return to see me in December. Today I gave her information on tamoxifen and anastrozole so she can start thinking about the difference between these agents. She understands that since her cancer was not life-threatening, the purpose of radiation and antiestrogens is not life prolongation, but reducing the risk of recurrence. With anti-estrogensspecifically reducing the risk of a new breast cancer developing in either breastis the greater motivator (in other words prophylaxis offers a greater benefit than treatment).  She knows to call for any problems that may develop before her next visit here.   Chauncey Cruel, MD   09/12/2015 3:45 PM Medical Oncology and Hematology [facility] [address] [place], [place] [address] Tel. [phone]    Fax. [phone]

## 2015-09-12 NOTE — Telephone Encounter (Signed)
Appointments made and avs pritned for patient °

## 2015-09-26 ENCOUNTER — Telehealth: Payer: Self-pay | Admitting: *Deleted

## 2015-09-26 NOTE — Telephone Encounter (Signed)
Left vm for pt to return call regarding needs. Contact information given.

## 2015-10-13 ENCOUNTER — Ambulatory Visit
Admission: RE | Admit: 2015-10-13 | Discharge: 2015-10-13 | Disposition: A | Discharge status: Home / Self Care | Payer: BLUE CROSS/BLUE SHIELD | Source: Ambulatory Visit | Attending: Radiation Oncology | Admitting: Radiation Oncology

## 2015-10-13 DIAGNOSIS — C50412 Malignant neoplasm of upper-outer quadrant of left female breast: Secondary | ICD-10-CM

## 2015-10-13 DIAGNOSIS — Z510 Encounter for antineoplastic radiation therapy: Secondary | ICD-10-CM | POA: Diagnosis not present

## 2015-10-13 NOTE — Progress Notes (Signed)
Name: Kim Porter   MRN: [id number]  Date:  10/13/2015  DOB: 1966-09-14  Status:outpatient   DIAGNOSIS: Left Breast cancer.  CONSENT VERIFIED: yes SET UP: Patient is setup supine  IMMOBILIZATION:  The following immobilization was used:Custom Moldable Pillow, breast board.  NARRATIVE: Kim Porter was brought to the [facility].  Identity was confirmed.  All relevant records and images related to the planned course of therapy were reviewed.  Then, the patient was positioned in a stable reproducible clinical set-up for radiation therapy.  Wires were placed to delineate the clinical extent of breast tissue. A wire was placed on the scar as well.  CT images were obtained.  An isocenter was placed. Skin markings were placed.  The position of the heart was then analyzed.  Due to the proximity of the heart to the chest wall, I felt she would benefit from deep inspiration breath hold for cardiac sparing.  She was then coached and rescanned in the breath hold position.  Acceptable cardiac sparing was achieved. The CT images were loaded into the planning software where the target and avoidance structures were contoured.  The radiation prescription was entered and confirmed. The patient was discharged in stable condition and tolerated simulation well.    TREATMENT PLANNING NOTE/3D Simulation Note Treatment planning then occurred. I have requested : MLC's, isodose plan, basic dose calculation  3D simulation was performed.  I personally designed and supervised the construction of 3 medically necessary complex treatment devices in the form of MLCs which will be used for beam modification and to protect critical structures including the heart and lung as well as the immobilization device which is necessary for reproducible set up.  I have requested a dose volume histogram of the heart, lung and tumor cavity.   Special treatment procedure was performed today due to the extra time and effort required  by myself to plan and prepare this patient for deep inspiration breath hold technique.  I have determined cardiac sparing to be of benefit to this patient to prevent long term cardiac damage due to radiation of the heart.  Bellows were placed on the patient's abdomen. To facilitate cardiac sparing, the patient was coached by the radiation therapists on breath hold techniques and breathing practice was performed. Practice waveforms were obtained. The patient was then scanned while maintaining breath hold in the treatment position.  This image was then transferred over to the imaging specialist. The imaging specialist then created a fusion of the free breathing and breath hold scans using the chest wall as the stable structure. I personally reviewed the fusion in axial, coronal and sagittal image planes.  Excellent cardiac sparing was obtained.  I felt the patient is an appropriate candidate for breath hold and the patient will be treated as such.  The image fusion was then reviewed with the patient to reinforce the necessity of reproducible breath hold.  This document serves as a record of services personally performed by Thea Silversmith , MD. It was created on her behalf by Lenn Cal, a trained medical scribe. The creation of this record is based on the scribe's personal observations and the provider's statements to them. This document has been checked and approved by the attending provider.  ------------------------------------------------  Thea Silversmith, MD

## 2015-10-14 DIAGNOSIS — Z510 Encounter for antineoplastic radiation therapy: Secondary | ICD-10-CM | POA: Diagnosis not present

## 2015-10-14 NOTE — Progress Notes (Signed)
Radiation Oncology         [phone] ________________________________  Name: Kim Porter      MRN: [id number]          Date: 10/13/2015              DOB: 09-25-1966  Optical Surface Tracking Plan:  Since intensity modulated radiotherapy (IMRT) and 3D conformal radiation treatment methods are predicated on accurate and precise positioning for treatment, intrafraction motion monitoring is medically necessary to ensure accurate and safe treatment delivery.  The ability to quantify intrafraction motion without excessive ionizing radiation dose can only be performed with optical surface tracking. Accordingly, surface imaging offers the opportunity to obtain 3D measurements of patient position throughout IMRT and 3D treatments without excessive radiation exposure.  I am ordering optical surface tracking for this patient's upcoming course of radiotherapy. ________________________________ Signature   Reference:   Ursula Alert, J, et al. Surface imaging-based analysis of intrafraction motion for breast radiotherapy patients.Journal of [place], n. 6, nov. 2014. ISSN 12751700.   Available at: <http://www.jacmp.org/index.php/jacmp/article/view/4957>.

## 2015-10-17 ENCOUNTER — Ambulatory Visit
Admission: RE | Admit: 2015-10-17 | Discharge: 2015-10-17 | Disposition: A | Discharge status: Home / Self Care | Payer: BLUE CROSS/BLUE SHIELD | Source: Ambulatory Visit | Attending: Radiation Oncology | Admitting: Radiation Oncology

## 2015-10-17 DIAGNOSIS — Z510 Encounter for antineoplastic radiation therapy: Secondary | ICD-10-CM | POA: Diagnosis not present

## 2015-10-18 ENCOUNTER — Ambulatory Visit
Admission: RE | Admit: 2015-10-18 | Discharge: 2015-10-18 | Disposition: A | Discharge status: Home / Self Care | Payer: BLUE CROSS/BLUE SHIELD | Source: Ambulatory Visit | Attending: Radiation Oncology | Admitting: Radiation Oncology

## 2015-10-18 DIAGNOSIS — Z510 Encounter for antineoplastic radiation therapy: Secondary | ICD-10-CM | POA: Diagnosis not present

## 2015-10-18 DIAGNOSIS — C50412 Malignant neoplasm of upper-outer quadrant of left female breast: Secondary | ICD-10-CM | POA: Insufficient documentation

## 2015-10-18 MED ORDER — ALRA NON-METALLIC DEODORANT (RAD-ONC)
1.0000 "application " | Freq: Once | TOPICAL | Status: AC
  Administered 2015-10-18: 1 via TOPICAL

## 2015-10-18 MED ORDER — RADIAPLEXRX EX GEL
Freq: Once | CUTANEOUS | Status: AC
  Administered 2015-10-18: 14:00:00 via TOPICAL

## 2015-10-18 NOTE — Addendum Note (Signed)
Encounter addended by: Heywood Footman, RN on: 10/18/2015  2:20 PM<BR>     Documentation filed: Inpatient Patient Education, Notes Section, Medications, Chief Complaint Section

## 2015-10-18 NOTE — Progress Notes (Signed)
Oriented patient to staff and routine of the clinic. Provided patient with RADIATION THERAPY AND YOU handbook then, reviewed pertinent information. Educated patient reference potential side effects and management such as, fatigue and skin changes. Provided patient with radiaplex and alra then, directed upon use. Afforded patient the opportunity to ask questions and answered those to the best of my ability. Patient verbalized understanding of all reviewed.

## 2015-10-18 NOTE — Addendum Note (Signed)
Encounter addended by: Heywood Footman, RN on: 10/18/2015  2:16 PM<BR>     Documentation filed: [employer], Inpatient MAR, Orders

## 2015-10-18 NOTE — Progress Notes (Signed)
Weekly Management Note Current Dose:  2.5 Gy  Projected Dose: 50 Gy   Narrative:  The patient presents for routine under treatment assessment.  CBCT/MVCT images/Port film x-rays were reviewed.  The chart was checked. Doing well. RN education performed.   Physical Findings: Weight:  . Unchanged  Impression:  The patient is tolerating radiation.  Plan:  Continue treatment as planned. Start radiaplex.

## 2015-10-19 ENCOUNTER — Ambulatory Visit
Admission: RE | Admit: 2015-10-19 | Discharge: 2015-10-19 | Disposition: A | Discharge status: Home / Self Care | Payer: BLUE CROSS/BLUE SHIELD | Source: Ambulatory Visit | Attending: Radiation Oncology | Admitting: Radiation Oncology

## 2015-10-19 DIAGNOSIS — Z510 Encounter for antineoplastic radiation therapy: Secondary | ICD-10-CM | POA: Diagnosis not present

## 2015-10-20 ENCOUNTER — Ambulatory Visit
Admission: RE | Admit: 2015-10-20 | Discharge: 2015-10-20 | Disposition: A | Discharge status: Home / Self Care | Payer: BLUE CROSS/BLUE SHIELD | Source: Ambulatory Visit | Attending: Radiation Oncology | Admitting: Radiation Oncology

## 2015-10-20 DIAGNOSIS — Z510 Encounter for antineoplastic radiation therapy: Secondary | ICD-10-CM | POA: Diagnosis not present

## 2015-10-21 ENCOUNTER — Ambulatory Visit
Admission: RE | Admit: 2015-10-21 | Discharge: 2015-10-21 | Disposition: A | Discharge status: Home / Self Care | Payer: BLUE CROSS/BLUE SHIELD | Source: Ambulatory Visit | Attending: Radiation Oncology | Admitting: Radiation Oncology

## 2015-10-21 DIAGNOSIS — Z510 Encounter for antineoplastic radiation therapy: Secondary | ICD-10-CM | POA: Diagnosis not present

## 2015-10-24 ENCOUNTER — Ambulatory Visit
Admission: RE | Admit: 2015-10-24 | Discharge: 2015-10-24 | Disposition: A | Discharge status: Home / Self Care | Payer: BLUE CROSS/BLUE SHIELD | Source: Ambulatory Visit | Attending: Radiation Oncology | Admitting: Radiation Oncology

## 2015-10-24 DIAGNOSIS — Z510 Encounter for antineoplastic radiation therapy: Secondary | ICD-10-CM | POA: Diagnosis not present

## 2015-10-25 ENCOUNTER — Encounter: Payer: Self-pay | Admitting: Radiation Oncology

## 2015-10-25 ENCOUNTER — Ambulatory Visit
Admission: RE | Admit: 2015-10-25 | Discharge: 2015-10-25 | Disposition: A | Discharge status: Home / Self Care | Payer: BLUE CROSS/BLUE SHIELD | Source: Ambulatory Visit | Attending: Radiation Oncology | Admitting: Radiation Oncology

## 2015-10-25 ENCOUNTER — Telehealth: Payer: Self-pay | Admitting: *Deleted

## 2015-10-25 VITALS — BP 128/80 | HR 77 | Temp 98.2°F | Ht 70.0 in | Wt 239.6 lb

## 2015-10-25 DIAGNOSIS — Z510 Encounter for antineoplastic radiation therapy: Secondary | ICD-10-CM | POA: Diagnosis not present

## 2015-10-25 DIAGNOSIS — C50412 Malignant neoplasm of upper-outer quadrant of left female breast: Secondary | ICD-10-CM

## 2015-10-25 NOTE — Progress Notes (Signed)
Weekly Management Note Current Dose:  15 Gy  Projected Dose: 50 Gy   Narrative:  The patient presents for routine under treatment assessment.  CBCT/MVCT images/Port film x-rays were reviewed.  The chart was checked. Doing well. Rash under breasts worse since not using antifungal.  Physical Findings: Weight: 239 lb 9.6 oz (108.682 kg). Unchanged  Impression:  The patient is tolerating radiation.  Plan:  Continue treatment as planned. Continue radiaplex.

## 2015-10-25 NOTE — Progress Notes (Signed)
Kim Porter has received 6 fractions to her left breast.  Denies any pain, nor fatigue.  Note erythema of breast at this time.

## 2015-10-25 NOTE — Telephone Encounter (Signed)
Spoke with patient to follow up after start of radiation.  She states she is doing well with no complaints.  Encouraged her to call with any needs or concerns.

## 2015-10-26 ENCOUNTER — Ambulatory Visit
Admission: RE | Admit: 2015-10-26 | Discharge: 2015-10-26 | Disposition: A | Discharge status: Home / Self Care | Payer: BLUE CROSS/BLUE SHIELD | Source: Ambulatory Visit | Attending: Radiation Oncology | Admitting: Radiation Oncology

## 2015-10-26 DIAGNOSIS — Z510 Encounter for antineoplastic radiation therapy: Secondary | ICD-10-CM | POA: Diagnosis not present

## 2015-10-27 ENCOUNTER — Ambulatory Visit
Admission: RE | Admit: 2015-10-27 | Discharge: 2015-10-27 | Disposition: A | Discharge status: Home / Self Care | Payer: BLUE CROSS/BLUE SHIELD | Source: Ambulatory Visit | Attending: Radiation Oncology | Admitting: Radiation Oncology

## 2015-10-27 DIAGNOSIS — Z510 Encounter for antineoplastic radiation therapy: Secondary | ICD-10-CM | POA: Diagnosis not present

## 2015-10-28 ENCOUNTER — Ambulatory Visit
Admission: RE | Admit: 2015-10-28 | Discharge: 2015-10-28 | Disposition: A | Discharge status: Home / Self Care | Payer: BLUE CROSS/BLUE SHIELD | Source: Ambulatory Visit | Attending: Radiation Oncology | Admitting: Radiation Oncology

## 2015-10-28 DIAGNOSIS — Z510 Encounter for antineoplastic radiation therapy: Secondary | ICD-10-CM | POA: Diagnosis not present

## 2015-10-30 ENCOUNTER — Ambulatory Visit
Admission: RE | Admit: 2015-10-30 | Discharge: 2015-10-30 | Disposition: A | Discharge status: Home / Self Care | Payer: BLUE CROSS/BLUE SHIELD | Source: Ambulatory Visit | Attending: Radiation Oncology | Admitting: Radiation Oncology

## 2015-10-30 DIAGNOSIS — Z510 Encounter for antineoplastic radiation therapy: Secondary | ICD-10-CM | POA: Diagnosis not present

## 2015-10-31 ENCOUNTER — Ambulatory Visit
Admission: RE | Admit: 2015-10-31 | Discharge: 2015-10-31 | Disposition: A | Discharge status: Home / Self Care | Payer: BLUE CROSS/BLUE SHIELD | Source: Ambulatory Visit | Attending: Radiation Oncology | Admitting: Radiation Oncology

## 2015-10-31 DIAGNOSIS — Z510 Encounter for antineoplastic radiation therapy: Secondary | ICD-10-CM | POA: Diagnosis not present

## 2015-11-01 ENCOUNTER — Ambulatory Visit
Admission: RE | Admit: 2015-11-01 | Discharge: 2015-11-01 | Disposition: A | Discharge status: Home / Self Care | Payer: BLUE CROSS/BLUE SHIELD | Source: Ambulatory Visit | Attending: Radiation Oncology | Admitting: Radiation Oncology

## 2015-11-01 DIAGNOSIS — C50412 Malignant neoplasm of upper-outer quadrant of left female breast: Secondary | ICD-10-CM

## 2015-11-01 DIAGNOSIS — Z510 Encounter for antineoplastic radiation therapy: Secondary | ICD-10-CM | POA: Diagnosis not present

## 2015-11-01 NOTE — Progress Notes (Signed)
Weekly Management Note Current Dose:  30 Gy  Projected Dose: 50 Gy   Narrative:  The patient presents for routine under treatment assessment.  CBCT/MVCT images/Port film x-rays were reviewed.  The chart was checked. Doing well. Rash under breasts stable. More itching medially.   Physical Findings: dermatitis medially. Inframammary folds clear.   Impression:  The patient is tolerating radiation.  Plan:  Continue treatment as planned. Continue radiaplex. Add hydrocortisone if needed.

## 2015-11-02 ENCOUNTER — Ambulatory Visit
Admission: RE | Admit: 2015-11-02 | Discharge: 2015-11-02 | Disposition: A | Discharge status: Home / Self Care | Payer: BLUE CROSS/BLUE SHIELD | Source: Ambulatory Visit | Attending: Radiation Oncology | Admitting: Radiation Oncology

## 2015-11-02 DIAGNOSIS — Z510 Encounter for antineoplastic radiation therapy: Secondary | ICD-10-CM | POA: Diagnosis not present

## 2015-11-07 ENCOUNTER — Ambulatory Visit
Admission: RE | Admit: 2015-11-07 | Discharge: 2015-11-07 | Disposition: A | Discharge status: Home / Self Care | Payer: BLUE CROSS/BLUE SHIELD | Source: Ambulatory Visit | Attending: Radiation Oncology | Admitting: Radiation Oncology

## 2015-11-07 DIAGNOSIS — Z510 Encounter for antineoplastic radiation therapy: Secondary | ICD-10-CM | POA: Diagnosis not present

## 2015-11-08 ENCOUNTER — Ambulatory Visit
Admission: RE | Admit: 2015-11-08 | Discharge: 2015-11-08 | Disposition: A | Discharge status: Home / Self Care | Payer: BLUE CROSS/BLUE SHIELD | Source: Ambulatory Visit | Attending: Radiation Oncology | Admitting: Radiation Oncology

## 2015-11-08 ENCOUNTER — Encounter: Payer: Self-pay | Admitting: Radiation Oncology

## 2015-11-08 ENCOUNTER — Ambulatory Visit: Payer: BLUE CROSS/BLUE SHIELD | Admitting: Radiation Oncology

## 2015-11-08 VITALS — BP 113/76 | HR 78 | Temp 98.0°F | Wt 241.6 lb

## 2015-11-08 DIAGNOSIS — Z510 Encounter for antineoplastic radiation therapy: Secondary | ICD-10-CM | POA: Diagnosis not present

## 2015-11-08 DIAGNOSIS — C50412 Malignant neoplasm of upper-outer quadrant of left female breast: Secondary | ICD-10-CM

## 2015-11-08 NOTE — Progress Notes (Signed)
Kim Porter is here for her 16th fraction of radiation to her Left Breast. She reports feeling tired over Thanksgiving, but states in general she "feels pretty good". Her Left anterior breast is red, and she reports it is very itchy at times. She has several small areas to her Left Mid Upper Chest that have opened possibly from scratching when she is not aware that she is doing it. Underneath her breast there is dry skin noticeable, but it has not started peeling. She is using hydrocortizone cream occasionally, and radiaplex cream as directed. I advised her to not use the radiaplex cream to the open areas, and use neosporin to help them to heal.   BP 113/76 mmHg  Pulse 78  Temp(Src) 98 F (36.7 C)  Wt 241 lb 9.6 oz (109.589 kg)

## 2015-11-08 NOTE — Progress Notes (Signed)
Weekly Management Note Current Dose: 40  Gy  Projected Dose: 50 Gy   Narrative:  The patient presents for routine under treatment assessment.  CBCT/MVCT images/Port film x-rays were reviewed.  The chart was checked. Itching medially. Some breakdown and pain under breast. Leaving for conference on Friday.   Physical Findings: Weight: 241 lb 9.6 oz (109.589 kg). Dermatitis medially. Early moist desquamation in the inframammary fold.   Impression:  The patient is tolerating radiation.  Plan:  Continue treatment as planned. Continue hydrocortisone to medial breast. Add neosporin to inframammary fold. Discussed post RT skin care. Discussed survivorship. Call with questions . Has appt with med onc.

## 2015-11-09 ENCOUNTER — Ambulatory Visit
Admission: RE | Admit: 2015-11-09 | Discharge: 2015-11-09 | Disposition: A | Discharge status: Home / Self Care | Payer: BLUE CROSS/BLUE SHIELD | Source: Ambulatory Visit | Attending: Radiation Oncology | Admitting: Radiation Oncology

## 2015-11-09 ENCOUNTER — Ambulatory Visit: Payer: BLUE CROSS/BLUE SHIELD

## 2015-11-09 DIAGNOSIS — Z510 Encounter for antineoplastic radiation therapy: Secondary | ICD-10-CM | POA: Diagnosis not present

## 2015-11-10 ENCOUNTER — Ambulatory Visit: Payer: BLUE CROSS/BLUE SHIELD

## 2015-11-10 ENCOUNTER — Ambulatory Visit
Admission: RE | Admit: 2015-11-10 | Discharge: 2015-11-10 | Disposition: A | Discharge status: Home / Self Care | Payer: BLUE CROSS/BLUE SHIELD | Source: Ambulatory Visit | Attending: Radiation Oncology | Admitting: Radiation Oncology

## 2015-11-10 DIAGNOSIS — Z510 Encounter for antineoplastic radiation therapy: Secondary | ICD-10-CM | POA: Diagnosis not present

## 2015-11-11 ENCOUNTER — Ambulatory Visit: Payer: BLUE CROSS/BLUE SHIELD

## 2015-11-11 ENCOUNTER — Encounter: Payer: Self-pay | Admitting: Radiation Oncology

## 2015-11-11 ENCOUNTER — Ambulatory Visit
Admission: RE | Admit: 2015-11-11 | Discharge: 2015-11-11 | Disposition: A | Discharge status: Home / Self Care | Payer: BLUE CROSS/BLUE SHIELD | Source: Ambulatory Visit | Attending: Radiation Oncology | Admitting: Radiation Oncology

## 2015-11-11 DIAGNOSIS — Z510 Encounter for antineoplastic radiation therapy: Secondary | ICD-10-CM | POA: Diagnosis not present

## 2015-11-14 ENCOUNTER — Ambulatory Visit: Payer: BLUE CROSS/BLUE SHIELD

## 2015-11-15 ENCOUNTER — Ambulatory Visit: Payer: BLUE CROSS/BLUE SHIELD

## 2015-11-15 ENCOUNTER — Encounter: Payer: Self-pay | Admitting: Radiation Oncology

## 2015-11-15 ENCOUNTER — Ambulatory Visit
Admission: RE | Admit: 2015-11-15 | Discharge: 2015-11-15 | Disposition: A | Discharge status: Home / Self Care | Payer: BLUE CROSS/BLUE SHIELD | Source: Ambulatory Visit | Attending: Radiation Oncology | Admitting: Radiation Oncology

## 2015-11-16 ENCOUNTER — Telehealth: Payer: Self-pay | Admitting: *Deleted

## 2015-11-16 ENCOUNTER — Other Ambulatory Visit: Payer: Self-pay | Admitting: Adult Health

## 2015-11-16 ENCOUNTER — Ambulatory Visit: Payer: BLUE CROSS/BLUE SHIELD

## 2015-11-16 DIAGNOSIS — C50412 Malignant neoplasm of upper-outer quadrant of left female breast: Secondary | ICD-10-CM

## 2015-11-16 NOTE — Telephone Encounter (Signed)
Error

## 2015-11-17 ENCOUNTER — Ambulatory Visit: Payer: BLUE CROSS/BLUE SHIELD

## 2015-11-17 ENCOUNTER — Telehealth: Payer: Self-pay | Admitting: *Deleted

## 2015-11-17 NOTE — Telephone Encounter (Signed)
Spoke with patient to follow up after completion of radiation.  She states she is doing well.  She does need to change her appointment with Dr. Jana Hakim due a conflict with another appointment.  Confirmed new appointment for 12/22 at [facility].  Encouraged her to call with any needs or concerns.

## 2015-11-18 ENCOUNTER — Ambulatory Visit: Payer: BLUE CROSS/BLUE SHIELD

## 2015-11-23 NOTE — Progress Notes (Signed)
  Radiation Oncology         [phone] ________________________________  Name: Kim Porter MRN: [id number]  Date: 11/11/2015  DOB: 1966/11/16  End of Treatment Note  Diagnosis:   Breast cancer of upper-outer quadrant of left female breast ([place])   Staging form: Breast, AJCC 7th Edition     Clinical: Stage 0 (Tis (DCIS), N0, M0) - Signed by Chauncey Cruel, MD on 08/16/2015    Indication for treatment:  Curative     Radiation treatment dates:   10/18/15-11/11/15  Site/dose:   Left breast/ 42.5 Gy at 2.5 Gy per fraction x 17 fractions.  Left breast boost/ 7.5 Gy at 2.5 Gy per fraction x 3 fractions  Beams/energy:  Opposed tangents with reduced fields / 10 MV photons Photon boost with 6 and 10 MV photons.   Narrative: The patient tolerated radiation treatment relatively well.   She had some moist desquamation in the inframammary fold which was treated with neosporin. She also developed dermatitis which was treated with hydrocortisone.   Plan: The patient has completed radiation treatment. The patient will return to radiation oncology clinic for routine followup in one month. I advised them to call or return sooner if they have any questions or concerns related to their recovery or treatment.  ------------------------------------------------  Thea Silversmith, MD

## 2015-11-23 NOTE — Progress Notes (Signed)
Name: Kim Porter   MRN: [id number]  Date:  11/01/15   DOB: 01-18-1966  Status:outpatient    DIAGNOSIS: Breast cancer of upper-outer quadrant of left female breast [facility])   Staging form: Breast, AJCC 7th Edition     Clinical: Stage 0 (Tis (DCIS), N0, M0) - Signed by Chauncey Cruel, MD on 08/16/2015   CONSENT VERIFIED: yes   SET UP: Patient is setup supine   IMMOBILIZATION:  The following immobilization was used:Custom Moldable Pillow, breast board.   NARRATIVE: [name] underwent complex simulation and treatment planning for her boost treatment today.  Her tumor volume was outlined on the planning CT scan.  Due to the depth of her cavity, electrons could not be used and a photon plan was developed. The plan will be prescribed to the 100 %  isodose line.   I personally supervised and approved the creation of 4 unique MLCs comprising 4   treatment devices.

## 2015-11-28 ENCOUNTER — Ambulatory Visit: Payer: BLUE CROSS/BLUE SHIELD | Admitting: Oncology

## 2015-12-01 ENCOUNTER — Telehealth: Payer: Self-pay | Admitting: Hematology and Oncology

## 2015-12-01 ENCOUNTER — Ambulatory Visit (HOSPITAL_BASED_OUTPATIENT_CLINIC_OR_DEPARTMENT_OTHER): Payer: BLUE CROSS/BLUE SHIELD | Admitting: Oncology

## 2015-12-01 VITALS — BP 115/69 | HR 78 | Temp 97.8°F | Resp 18 | Ht 70.0 in | Wt 242.1 lb

## 2015-12-01 DIAGNOSIS — D0512 Intraductal carcinoma in situ of left breast: Secondary | ICD-10-CM | POA: Diagnosis not present

## 2015-12-01 DIAGNOSIS — C50412 Malignant neoplasm of upper-outer quadrant of left female breast: Secondary | ICD-10-CM

## 2015-12-01 MED ORDER — TAMOXIFEN CITRATE 20 MG PO TABS: 20.0000 mg | ORAL_TABLET | Freq: Every day | ORAL | Status: AC

## 2015-12-01 NOTE — Progress Notes (Signed)
[place]  [phone] Fax:([phone]     ID: Kim Porter DOB: [birth year]  MR#: [id number]  VXB#:[id number]  Patient Care Team: Jefm Petty, MD as PCP - General (Family Medicine) Vania Rea, MD as Consulting Physician (Obstetrics and Gynecology) Chauncey Cruel, MD as Consulting Physician (Oncology) Fanny Skates, MD as Consulting Physician (General Surgery) PCP: Wonda Cerise, MD OTHER MD: Dyke Brackett MD, Nena Polio MD, Thea Silversmith M.D.  CHIEF COMPLAINT: ductal carcinoma in situ  CURRENT TREATMENT:    BREAST CANCER HISTORY: From the original intake note:  Kim Porter had bilateral screening mammography with tomography 07/11/2015 showing calcifications in the left breast, requiring further imaging.on 07/15/2015 she underwent a diagnostic left mammography which showed the breast density to be category C. 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  Her subsequent history is as detailed below.  INTERVAL HISTORY:  Tiffany returns today for follow-up of her ductal carcinoma in situ. Since her last visit here she completed the radiation to her left breast. She had no fatigue. She did have some dry desquamation. This is resolving. She is now ready to consider anti-estrogens.   REVIEW OF SYSTEMS: She is doing well as far as her rheumatoid arthritis is concerned. She has been restarted on methotrexate and prednisone at low doses (she is not sure of the actual dose). A detailed review of systems today was otherwise negative.  PAST MEDICAL HISTORY: Past Medical History  Diagnosis Date  . Breast cancer of upper-outer quadrant of left female breast ([place]) 08/12/2015  . Rheumatoid arthritis ([place])      PAST SURGICAL HISTORY: Past Surgical History  Procedure Laterality Date  . Eye surgery    . Lasix surgery bilateral     . Breast lumpectomy with radioactive seed localization Left 09/02/2015    Procedure: LEFT BREAST LUMPECTOMY WITH RADIOACTIVE SEED LOCALIZATION;  Surgeon: Fanny Skates, MD;  Location: [facility];  Service: General;  Laterality: Left;    FAMILY HISTORY Family History  Problem Relation Age of Onset  . Breast cancer Mother 44  . Colon polyps Mother 69  . Other Sister     has had "7 lumpectomies, all benign"  . Diabetes Sister   . Cancer Maternal Grandmother     unknown primary - metastasized  . Emphysema Maternal Grandfather   . Cancer Maternal Uncle 61    unspecified type; "caused by agent orange"  . Breast cancer Other     (x2); dx. 21 and 75  . Breast cancer Cousin 66    multiple primaries; negative genetic testing   . Alzheimer's disease Other     the patient's parents are still living, in their late 27s. The patient's mother was diagnosed with breast cancer the age of 57. The maternal grandmother may have had breast cancer diagnosed at age 35. 45 of the patient's mother's maternal aunts were diagnosed with breast cancer at the ages of 56 and 78. The patient also has a second cousin diagnosed with breast cancer at age 63. This cousin has been tested for a mutation  ( extensive panel) and no mutation was found  GYNECOLOGIC HISTORY:  No LMP recorded.  Menarche age 49 , the patient is GX P0. She is still having regular periods. 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 Her husband, Kim Porter, is a Customer service manager. They have land and keep approximately 75 horses, many of them theirs or otherwise boarded. They also have 3 dogs and one cat.    ADVANCED DIRECTIVES:  Not in place   HEALTH MAINTENANCE: Social History  Substance Use Topics  . Smoking status:  Former Smoker    Types: Cigarettes  . Smokeless tobacco: Never Used     Comment: 1 ppwk for 4 yrs in college  . Alcohol Use: 0.6 oz/week    1 Cans of beer per week     Comment: Drinks 1-2 beers a week     Colonoscopy:  PAP:  Bone density:  Lipid panel:  No Known Allergies  Current Outpatient Prescriptions  Medication Sig Dispense Refill  . FOLIC ACID PO Take by mouth.    . methotrexate (50 MG/ML) 1 G injection Inject into the vein once. Takes 0.8 mls SQ once a week    . Omega-3 Fatty Acids (FISH OIL PO) Take 1,200 mg by mouth daily.    . predniSONE (DELTASONE) 1 MG tablet Take 1 mg by mouth daily with breakfast. Near end of tapering dose, which ends on 11/01/15    . tamoxifen (NOLVADEX) 20 MG tablet Take 1 tablet (20 mg total) by mouth daily. 90 tablet 12   No current facility-administered medications for this visit.    OBJECTIVE:  Middle-aged white woman who looks well Filed Vitals:   12/01/15 0952  BP: 115/69  Pulse: 78  Temp: 97.8 F (36.6 C)  Resp: 18     Body mass index is 34.74 kg/(m^2).    ECOG FS:0 - Asymptomatic  Sclerae unicteric, pupils round and equal Oropharynx clear and moist-- no thrush or other lesions No cervical or supraclavicular adenopathy Lungs no rales or rhonchi Heart regular rate and rhythm Abd soft, nontender, positive bowel sounds MSK no focal spinal tenderness, no upper extremity lymphedema Neuro: nonfocal, well oriented, appropriate affect Breasts: The right breast is unremarkable. The left breast is status post lumpectomy and radiation. The cosmetic result is excellent. There is minimal residual hyperpigmentation and there is minimal dry desquamation in the inframammary fold. There is no evidence of disease recurrence. The left axilla is benign.  LAB RESULTS:  CMP     Component Value Date/Time   NA 139 08/16/2015 1620   K 3.6 08/16/2015 1620   CO2 23 08/16/2015 1620   GLUCOSE 104 08/16/2015 1620   BUN 11.2 08/16/2015 1620    CREATININE 0.8 08/16/2015 1620   CALCIUM 9.5 08/16/2015 1620   PROT 7.5 08/16/2015 1620   ALBUMIN 4.2 08/16/2015 1620   AST 18 08/16/2015 1620   ALT 25 08/16/2015 1620   ALKPHOS 53 08/16/2015 1620   BILITOT 0.30 08/16/2015 1620    INo results found for: SPEP, UPEP  Lab Results  Component Value Date   WBC 10.7* 08/16/2015   NEUTROABS 5.9 08/16/2015   HGB 13.5 08/16/2015   HCT 39.9 08/16/2015   MCV 97.3 08/16/2015   PLT 272 08/16/2015      Chemistry      Component Value Date/Time   NA 139 08/16/2015 1620   K 3.6 08/16/2015 1620   CO2 23 08/16/2015 1620   BUN 11.2 08/16/2015 1620   CREATININE 0.8 08/16/2015 1620      Component Value Date/Time   CALCIUM 9.5 08/16/2015 1620   ALKPHOS 53 08/16/2015 1620  AST 18 08/16/2015 1620   ALT 25 08/16/2015 1620   BILITOT 0.30 08/16/2015 1620       No results found for: LABCA2  No components found for: [id number]  No results for input(s): INR in the last 168 hours.  Urinalysis No results found for: COLORURINE, APPEARANCEUR, LABSPEC, PHURINE, GLUCOSEU, HGBUR, BILIRUBINUR, KETONESUR, PROTEINUR, UROBILINOGEN, NITRITE, LEUKOCYTESUR  STUDIES: No results found.  ASSESSMENT: 49 y.o. [place] woman status post left breast upper outer quadrant biopsy 08/05/2015 for ductal carcinoma in situ, high-grade, estrogen and progesterone receptor positive   (1) genetics testing 09/05/2015 through the Breast/Ovarian Cancer Panel offered by [facility] found no deleterious mutations or variants of uncertain significance in ATM, BARD1, BRCA1, BRCA2, BRIP1, CDH1, CHEK2, FANCC, MLH1, MSH2, MSH6, NBN, PALB2, PMS2, PTEN, RAD51C, RAD51D, TP53, and XRCC2.  This panel also includes deletion/duplication analysis (without sequencing) for one gene, EPCAM.    (2) left lumpectomy 09/02/2015 showed residual high-grade ductal carcinoma in situ, with negative and ample margins  (3) adjuvant radiation 10/18/15-11/11/15 ;  Left breast/ 42.5 Gy at 2.5 Gy per fraction x  17 fractions.  Left breast boost/ 7.5 Gy at 2.5 Gy per fraction x 3 fractions   (4) tamoxifen to start January 2017  PLAN: We discussed Tiney is situation in detail and she understands with a noninvasive breast cancer we are not talking about life-threatening disease but the risk of recurrence. The risk of recurrence in her breast is going to be quite low after radiation, 10% or less. Nevertheless that would be cut in half by anti-estrogens.  I think a better reason to consider anti-estrogens in her case is the risk of a new breast cancer developing in either breast. That risk can be as high as 1% per year. Either tamoxifen or an aromatase inhibitor would cut that in half.  We discussed the possible toxicities, side effects and complications of both agents. She was given that information in writing as well as. After much discussion she decided she would like to try tamoxifen (which is what her mother took, with very good tolerance).  She will start tamoxifen in January. If she is tolerating it well we will consider a vaginal estrogens as needed for vaginal dryness issues. From that point we can start seeing her on a once a year basis.  She knows to call for any problems that may develop before her next visit here.   Chauncey Cruel, MD   12/01/2015 9:54 AM Medical Oncology and Hematology [facility] [address] [place], [place] [address] Tel. [phone]    Fax. [phone]

## 2015-12-01 NOTE — Telephone Encounter (Signed)
Appointments made and avs printed for patient °

## 2015-12-15 NOTE — Progress Notes (Signed)
   Department of Radiation Oncology  Phone:  [phone] Fax:        [phone]   Name: Kim Porter MRN: [id number]  DOB: 1966/09/22  Date: 12/16/2015  Follow Up Visit Note  Diagnosis: Breast cancer of upper-outer quadrant of left female breast [facility])   Staging form: Breast, AJCC 7th Edition     Clinical: Stage 0 (Tis (DCIS), N0, M0) - Signed by Chauncey Cruel, MD on 08/16/2015   Summary and Interval since last radiation: 10/18/15-11/11/15  Site/dose:   Left breast/ 42.5 Gy at 2.5 Gy per fraction x 17 fractions.  Left breast boost/ 7.5 Gy at 2.5 Gy per fraction x 3 fractions  Interval History: Kim Porter presents today for routine followup.  She is healing well. She is tolerating her tamoxifen well.   Physical Exam:  Filed Vitals:   12/16/15 1639  BP: 135/79  Pulse: 90  Temp: 97.8 F (36.6 C)  Height: 5\' 10"  (1.778 m)  Weight: 243 lb 12.8 oz (110.587 kg)   Well healed breast. Minimal tanning  IMPRESSION: Kim Porter is a 50 y.o. female s/p radiation with resolving acute effects of treatment.   PLAN:  She is doing well. We discussed the need for follow up every 4-6 months which she has scheduled.  We discussed the need for yearly mammograms which she can schedule with her OBGYN or with medical oncology. We discussed the need for sun protection in the treated area.  She can always call me with questions.  I will follow up with her on an as needed basis.    Thea Silversmith, MD  This document serves as a record of services personally performed by Thea Silversmith, MD. It was created on her behalf by Darcus Austin, a trained medical scribe. The creation of this record is based on the scribe's personal observations and the provider's statements to them. This document has been checked and approved by the attending provider.

## 2015-12-16 ENCOUNTER — Ambulatory Visit
Admission: RE | Admit: 2015-12-16 | Discharge: 2015-12-16 | Disposition: A | Discharge status: Home / Self Care | Payer: BLUE CROSS/BLUE SHIELD | Source: Ambulatory Visit | Attending: Radiation Oncology | Admitting: Radiation Oncology

## 2015-12-16 ENCOUNTER — Encounter: Payer: Self-pay | Admitting: Oncology

## 2015-12-16 VITALS — BP 135/79 | HR 90 | Temp 97.8°F | Ht 70.0 in | Wt 243.8 lb

## 2015-12-16 DIAGNOSIS — C50412 Malignant neoplasm of upper-outer quadrant of left female breast: Secondary | ICD-10-CM

## 2015-12-16 HISTORY — DX: Reserved for concepts with insufficient information to code with codable children: IMO0002

## 2015-12-16 HISTORY — DX: Reserved for inherently not codable concepts without codable children: IMO0001

## 2015-12-16 NOTE — Progress Notes (Signed)
Kim Porter with no voiced complaints.  She has "mild" tanning of her  Left breast.

## 2015-12-27 ENCOUNTER — Telehealth: Payer: Self-pay

## 2015-12-27 ENCOUNTER — Telehealth: Payer: Self-pay | Admitting: Oncology

## 2015-12-27 ENCOUNTER — Other Ambulatory Visit: Payer: Self-pay

## 2015-12-27 NOTE — Telephone Encounter (Signed)
Called and left a message with follow up for 1/18

## 2015-12-27 NOTE — Telephone Encounter (Signed)
Patient had called in at 11:05 and left a message that she has a swollen gland or lump that she needs looked at,the voice mail was transferred to triage

## 2015-12-27 NOTE — Telephone Encounter (Signed)
Voicemail Message: "I've completed chemotherapy and RT.  I found a lump under one of my armpitts.  I'd like Dr. Jana Hakim to take a look at it or someone to help find out if this is a swoolen lymph node or what.  Scheduled appointments are 01-19-2016 survivorship appointment and 03-12-2016 with Dr. Jana Hakim.

## 2015-12-27 NOTE — Telephone Encounter (Signed)
Patient called today concerned about a "lump" under her arm- in the armpit area.  She feels that she had some swelling in the area since her surgery in September however recently the area has become more swollen and she states that she can actually feel a lump. Per Dr. Jana Hakim patient can be seen by him tomorrow at 9:30.

## 2015-12-28 ENCOUNTER — Telehealth: Payer: Self-pay | Admitting: Oncology

## 2015-12-28 ENCOUNTER — Ambulatory Visit (HOSPITAL_BASED_OUTPATIENT_CLINIC_OR_DEPARTMENT_OTHER): Payer: BLUE CROSS/BLUE SHIELD | Admitting: Oncology

## 2015-12-28 VITALS — BP 125/69 | HR 83 | Temp 98.2°F | Resp 18 | Ht 70.0 in | Wt 241.9 lb

## 2015-12-28 DIAGNOSIS — C50412 Malignant neoplasm of upper-outer quadrant of left female breast: Secondary | ICD-10-CM

## 2015-12-28 DIAGNOSIS — Z170 Estrogen receptor positive status [ER+]: Secondary | ICD-10-CM

## 2015-12-28 DIAGNOSIS — D0512 Intraductal carcinoma in situ of left breast: Secondary | ICD-10-CM

## 2015-12-28 NOTE — Progress Notes (Signed)
[place]  Telephone:([phone] Fax:(336) [phone]     ID: Kim Porter DOB: 12-Jan-1966  MR#: [id number]  [id number]#:[id number]  Patient Care Team: Jefm Petty, MD as PCP - General (Family Medicine) Vania Rea, MD as Consulting Physician (Obstetrics and Gynecology) Chauncey Cruel, MD as Consulting Physician (Oncology) Fanny Skates, MD as Consulting Physician (General Surgery) PCP: Wonda Cerise, MD OTHER MD: Dyke Brackett MD, Nena Polio MD, Thea Silversmith M.D.  CHIEF COMPLAINT: ductal carcinoma in situ  CURRENT TREATMENT: Tamoxifen   BREAST CANCER HISTORY: From the original intake note:  Rafaelita had bilateral screening mammography with tomography 07/11/2015 showing calcifications in the left breast, requiring further imaging.on 07/15/2015 she underwent a diagnostic left mammography which showed the breast density to be category C. 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  Her subsequent history is as detailed below.  INTERVAL HISTORY: Kim Porter returns today for follow-up of her estrogen receptor positive ductal carcinoma in situ. She started tamoxifen 12/11/2015. So far she is tolerating it well. She has not had any hot flashes or vaginal wetness.  She came here because she became alarmed that there had been "a change was "in her left armpit. With further questioning today it's not really a change but an asymmetry and she tells me this really has been present since her surgery. Nevertheless it concerns her.  REVIEW OF SYSTEMS: Joint issues from her rheumatoid arthritis, especially over her hands, are the main problem she is having. She is pleased to have been able to cut her prednisone dose further.  She maintains an excellent functional status and greatly enjoyed the holidays. A detailed review of systems today was otherwise negative.  PAST MEDICAL HISTORY: Past Medical History  Diagnosis Date  . Breast cancer of upper-outer quadrant of left female breast ([place]) 08/12/2015  . Rheumatoid arthritis ([place])   . Radiation 10/18/15-11/11/15    left breast 42.5 Gy, left breast boost 7.5 Gy    PAST SURGICAL HISTORY: Past Surgical History  Procedure Laterality Date  . Eye surgery    . Lasix surgery bilateral     . Breast lumpectomy with radioactive seed localization Left 09/02/2015    Procedure: LEFT BREAST LUMPECTOMY WITH RADIOACTIVE SEED LOCALIZATION;  Surgeon: Fanny Skates, MD;  Location: [facility];  Service: General;  Laterality: Left;    FAMILY HISTORY Family History  Problem Relation Age of Onset  . Breast cancer Mother 56  . Colon polyps Mother 61  . Other Sister     has had "7 lumpectomies, all benign"  . Diabetes Sister   . Cancer Maternal Grandmother     unknown primary - metastasized  . Emphysema Maternal Grandfather   . Cancer Maternal Uncle 61    unspecified type; "caused by agent orange"  . Breast cancer Other     (x2); dx. 33 and 75  . Breast cancer Cousin 66    multiple primaries; negative genetic testing   . Alzheimer's disease Other     the patient's parents are still living, in their late 49s. The patient's mother was diagnosed with breast cancer the age of 47. The maternal grandmother may have had breast cancer diagnosed at age 7. 37 of the patient's mother's maternal aunts were diagnosed with breast cancer at the ages of  76 and 72. The patient also has a second cousin diagnosed with breast cancer at age 49. This cousin has been tested for a mutation  ( extensive panel) and no mutation was found  GYNECOLOGIC HISTORY:  No LMP recorded.  Menarche age 74 , the patient is GX P0. She is still having regular periods. 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 Her husband, Kim Porter, is a Paediatric nurse. They have land and keep approximately 75 horses, many of them theirs or otherwise boarded. They also have 3 dogs and one cat.    ADVANCED DIRECTIVES:  Not in place   HEALTH MAINTENANCE: Social History  Substance Use Topics  . Smoking status: Former Smoker    Types: Cigarettes  . Smokeless tobacco: Never Used     Comment: 1 ppwk for 4 yrs in college  . Alcohol Use: 0.6 oz/week    1 Cans of beer per week     Comment: Drinks 1-2 beers a week     Colonoscopy:  PAP:  Bone density:  Lipid panel:  No Known Allergies  Current Outpatient Prescriptions  Medication Sig Dispense Refill  . FOLIC ACID PO Take by mouth.    . methotrexate (50 MG/ML) 1 G injection Inject into the vein once. Takes 0.8 mls SQ once a week    . Omega-3 Fatty Acids (FISH OIL PO) Take 1,200 mg by mouth daily.    . predniSONE (DELTASONE) 1 MG tablet Take 0.25 mg by mouth daily with breakfast.    . tamoxifen (NOLVADEX) 20 MG tablet Take 1 tablet (20 mg total) by mouth daily. 90 tablet 12   No current facility-administered medications for this visit.    OBJECTIVE:  Middle-aged white woman in no acute distress Filed Vitals:   12/28/15 0957  BP: 125/69  Pulse: 83  Temp: 98.2 F (36.8 C)  Resp: 18     Body mass index is 34.71 kg/(m^2).    ECOG FS:1 - Symptomatic but completely ambulatory  Sclerae unicteric, pupils round and equal Oropharynx clear and moist-- no thrush or other lesions No cervical or supraclavicular adenopathy Lungs no rales or rhonchi Heart regular rate and rhythm Abd soft, nontender, positive bowel sounds MSK no focal spinal tenderness, no upper extremity lymphedema Neuro: nonfocal, well oriented, appropriate affect Breasts: The right breast is unremarkable. The left breast is still minimally pink from the recent radiation treatments. It is very  symmetrical as compared with the right, except for a very minimal indentation laterally where she had the biopsy. When she lies flat the nipple sinks slightly. These are of course expected changes. Careful palpation of the left axilla, left upper arm medially and the breast itself, show no suspicious or worrisome findings    LAB RESULTS:  CMP     Component Value Date/Time   NA 139 08/16/2015 1620   K 3.6 08/16/2015 1620   CO2 23 08/16/2015 1620   GLUCOSE 104 08/16/2015 1620   BUN 11.2 08/16/2015 1620   CREATININE 0.8 08/16/2015 1620   CALCIUM 9.5 08/16/2015 1620   PROT 7.5 08/16/2015 1620   ALBUMIN 4.2 08/16/2015 1620   AST 18 08/16/2015 1620   ALT 25 08/16/2015 1620   ALKPHOS 53 08/16/2015 1620   BILITOT 0.30 08/16/2015 1620    INo results found for: SPEP, UPEP  Lab Results  Component Value Date   WBC 10.7* 08/16/2015   NEUTROABS 5.9 08/16/2015  HGB 13.5 08/16/2015   HCT 39.9 08/16/2015   MCV 97.3 08/16/2015   PLT 272 08/16/2015      Chemistry      Component Value Date/Time   NA 139 08/16/2015 1620   K 3.6 08/16/2015 1620   CO2 23 08/16/2015 1620   BUN 11.2 08/16/2015 1620   CREATININE 0.8 08/16/2015 1620      Component Value Date/Time   CALCIUM 9.5 08/16/2015 1620   ALKPHOS 53 08/16/2015 1620   AST 18 08/16/2015 1620   ALT 25 08/16/2015 1620   BILITOT 0.30 08/16/2015 1620       No results found for: LABCA2  No components found for: LABCA125  No results for input(s): INR in the last 168 hours.  Urinalysis No results found for: COLORURINE, APPEARANCEUR, LABSPEC, PHURINE, GLUCOSEU, HGBUR, BILIRUBINUR, KETONESUR, PROTEINUR, UROBILINOGEN, NITRITE, LEUKOCYTESUR  STUDIES: No results found.  ASSESSMENT: 50 y.o. [place] woman status post left breast upper outer quadrant biopsy 08/05/2015 for ductal carcinoma in situ, high-grade, estrogen and progesterone receptor positive   (1) genetics testing 09/05/2015 through the Breast/Ovarian Cancer Panel offered  by [facility] found no deleterious mutations or variants of uncertain significance in ATM, BARD1, BRCA1, BRCA2, BRIP1, CDH1, CHEK2, FANCC, MLH1, MSH2, MSH6, NBN, PALB2, PMS2, PTEN, RAD51C, RAD51D, TP53, and XRCC2.  This panel also includes deletion/duplication analysis (without sequencing) for one gene, EPCAM.    (2) left lumpectomy 09/02/2015 showed residual high-grade ductal carcinoma in situ, with negative and ample margins  (3) adjuvant radiation 10/18/15-11/11/15 ;  Left breast/ 42.5 Gy at 2.5 Gy per fraction x 17 fractions.  Left breast boost/ 7.5 Gy at 2.5 Gy per fraction x 3 fractions   (4) tamoxifen to start January 2017  PLAN: Laterrica is lateral left breast area and the breast itself is showing the expected changes posttreatment. Actually the cosmetic result is excellent and those changes are minimal. Certainly I don't see or feel anything that would be worrisome for residual or recurrent disease.  I was going to see her in April but since she seems to be tolerating the tamoxifen so well I am canceling that appointment and scheduling her for August, after her next set of mammograms.  Of course she is scheduled with the survivorship nurse practitioner in February and if there are any issues relating to tamoxifen they could be addressed also at that time.  The overall plan is to continue tamoxifen for a total of 5 years.  [place] no stroke call for any problems that may develop before her next visit here.   Chauncey Cruel, MD   12/28/2015 10:09 AM Medical Oncology and Hematology [facility] [address] [place], [place] [address] Tel. [phone]    Fax. [phone]

## 2015-12-28 NOTE — Telephone Encounter (Signed)
Printed avs report and r/s appointments from 4/4 to august

## 2016-01-03 ENCOUNTER — Telehealth: Payer: Self-pay

## 2016-01-03 NOTE — Telephone Encounter (Signed)
Received fax from patient with the supplements she is taking.  Dr. Jana Hakim looked at the two page document and writer called patient back to let her know that there isn't any data that shows interference with tamoxifen which patient was worried about.  List of supplements sent to scanning.

## 2016-01-19 ENCOUNTER — Ambulatory Visit (HOSPITAL_BASED_OUTPATIENT_CLINIC_OR_DEPARTMENT_OTHER)
Admission: RE | Admit: 2016-01-19 | Discharge: 2016-01-19 | Disposition: A | Discharge status: Home / Self Care | Payer: BLUE CROSS/BLUE SHIELD | Source: Ambulatory Visit | Attending: Nurse Practitioner | Admitting: Nurse Practitioner

## 2016-01-19 ENCOUNTER — Ambulatory Visit (HOSPITAL_BASED_OUTPATIENT_CLINIC_OR_DEPARTMENT_OTHER): Payer: BLUE CROSS/BLUE SHIELD | Admitting: Nurse Practitioner

## 2016-01-19 ENCOUNTER — Encounter: Payer: Self-pay | Admitting: Nurse Practitioner

## 2016-01-19 VITALS — BP 116/69 | HR 87 | Temp 98.7°F | Resp 18 | Ht 70.0 in | Wt 236.4 lb

## 2016-01-19 DIAGNOSIS — M7122 Synovial cyst of popliteal space [Baker], left knee: Secondary | ICD-10-CM | POA: Diagnosis not present

## 2016-01-19 DIAGNOSIS — D0512 Intraductal carcinoma in situ of left breast: Secondary | ICD-10-CM | POA: Diagnosis not present

## 2016-01-19 DIAGNOSIS — M7989 Other specified soft tissue disorders: Secondary | ICD-10-CM

## 2016-01-19 DIAGNOSIS — M069 Rheumatoid arthritis, unspecified: Secondary | ICD-10-CM

## 2016-01-19 DIAGNOSIS — C50412 Malignant neoplasm of upper-outer quadrant of left female breast: Secondary | ICD-10-CM | POA: Insufficient documentation

## 2016-01-19 NOTE — Progress Notes (Signed)
CLINIC:  Cancer Survivorship   REASON FOR VISIT:  Routine follow-up post-treatment for a recent history of breast cancer.  BRIEF ONCOLOGIC HISTORY:    Breast cancer of upper-outer quadrant of left female breast ([place])   07/11/2015 Mammogram Left breast: calcifications warranting further evaluation   08/05/2015 Initial Biopsy Left breast core needle bx: DCIS, high grade, ER+, PR+   08/19/2015 Breast MRI There is a 4.1 x 1.9 x 2.9 cm enhancement with washout kinetics with associated biopsy clip artifact in the posterior upper-outer quadrant left breast. Nothing in right breast.  No LAD.   08/19/2015 Clinical Stage Stage 0: Tis N0   09/02/2015 Definitive Surgery Left lumpectomy: DCIS, high grade, negative / ample margins   09/02/2015 Pathologic Stage Stage 0: Tis N0   09/05/2015 Procedure Breast/Ovarian panel ([facility]) no clinically significant variant at  ATM, BARD1, BRCA1, BRCA2, BRIP1, CDH1, CHEK2, FANCC, MLH1, MSH2, MSH6, NBN, PALB2, PMS2, PTEN, RAD51C, RAD51D, TP53, and XRCC2.   10/28/2015 - 11/11/2015 Radiation Therapy Left breast/ 42.5 Gy at 2.5 Gy per fraction x 17 fractions.  Left breast boost/ 7.5 Gy at 2.5 Gy per fraction x 3 fractions   12/2015 -  Anti-estrogen oral therapy Tamoxifen 20 mg daily. Planned duration of therapy 5 years.     INTERVAL HISTORY:  Ms. Kniskern presents to the [facility] today for our initial meeting to review her survivorship care plan detailing her treatment course for breast cancer, as well as monitoring long-term side effects of that treatment, education regarding health maintenance, screening, and overall wellness and health promotion.     Overall, Ms. Caton reports feeling pretty well since completing her radiation therapy approximately two months ago.  She has some mild fatigue, which is intermittent and does not limit her activities.  She reports the skin changes overlying her left breast have improved since completion of the radiation.  She has noted some  thickening in her left breast since radiation.  She began her tamoxifen in January 2017 and so far, is doing well without significant hot flashes.  She does report some swelling and calf pain in her left lower leg that she thinks is related to recently coming off her prednisone for her RA.  When questioned, she thinks it may a little bit worse following the initiation of her tamoxifen. She denies any numbness in tingling her in left lower leg.  She denies any vaginal bleeding other than her menstrual period that occurred last week.  It was normal in flow and duration. She denies any headache, cough, or shortness of breath.  She has a good appetite and denies any weight loss.    REVIEW OF SYSTEMS:  General: Occasional fatigue as above. Denies fever, chills, unintentional weight loss, or night sweats.  HEENT: Denies visual changes, hearing loss, mouth sores or difficulty swallowing. Cardiac: Denies palpitations, chest pain, and lower extremity edema.  Respiratory: Denies wheeze or dyspnea on exertion.  Breast: Occasional left sided breast pain. Otherwise, denies any new nodularity, masses, tenderness, nipple changes, or nipple discharge.  GI: Denies abdominal pain, constipation, diarrhea, nausea, or vomiting.  GU: Denies dysuria, hematuria, vaginal bleeding, vaginal discharge, or vaginal dryness.  Musculoskeletal: Left lower leg swelling and pain as above.  Generalized joint aches due to RA.  RA nodules in bilateral hands. Neuro: Denies recent fall or numbness / tingling in her extremities. Skin: Denies rash, pruritis, or open wounds.  Psych: Denies depression, anxiety, insomnia, or memory loss.   A 14-point review of systems was  completed and was negative, except as noted above.   ONCOLOGY TREATMENT TEAM:  1. Surgeon:  Dr. Dalbert Batman at [facility]  2. Medical Oncologist: Dr. Jana Hakim 3. Radiation Oncologist: Dr. Pablo Ledger    PAST MEDICAL/SURGICAL HISTORY:  Past Medical History    Diagnosis Date  . Breast cancer of upper-outer quadrant of left female breast ([place]) 08/12/2015  . Rheumatoid arthritis ([place])   . Radiation 10/18/15-11/11/15    left breast 42.5 Gy, left breast boost 7.5 Gy   Past Surgical History  Procedure Laterality Date  . Eye surgery    . Lasix surgery bilateral     . Breast lumpectomy with radioactive seed localization Left 09/02/2015    Procedure: LEFT BREAST LUMPECTOMY WITH RADIOACTIVE SEED LOCALIZATION;  Surgeon: Fanny Skates, MD;  Location: [facility];  Service: General;  Laterality: Left;     ALLERGIES:  No Known Allergies   CURRENT MEDICATIONS:  Current Outpatient Prescriptions on File Prior to Visit  Medication Sig Dispense Refill  . FOLIC ACID PO Take by mouth.    . methotrexate (50 MG/ML) 1 G injection Inject into the vein once. Takes 0.8 mls SQ once a week    . Omega-3 Fatty Acids (FISH OIL PO) Take 1,200 mg by mouth daily.     No current facility-administered medications on file prior to visit.     ONCOLOGIC FAMILY HISTORY:  Family History  Problem Relation Age of Onset  . Breast cancer Mother 39  . Colon polyps Mother 39  . Other Sister     has had "7 lumpectomies, all benign"  . Diabetes Sister   . Cancer Maternal Grandmother     unknown primary - metastasized  . Emphysema Maternal Grandfather   . Cancer Maternal Uncle 61    unspecified type; "caused by agent orange"  . Breast cancer Other     (x2); dx. 88 and 75  . Breast cancer Cousin 66    multiple primaries; negative genetic testing   . Alzheimer's disease Other      GENETIC COUNSELING/TESTING: Yes, performed 09/05/2015.  Breast/Ovarian panel ([facility]) reveals no clinically significant variant at  ATM, BARD1, BRCA1, BRCA2, BRIP1, CDH1, CHEK2, FANCC, MLH1, MSH2, MSH6, NBN, PALB2, PMS2, PTEN, RAD51C, RAD51D, TP53, and XRCC2.   SOCIAL HISTORY:  Towana Stenglein is married and lives with her spouse in [place], [place].  She has no children.  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  She uses alcohol occasionally, approximately 1-2 glasses / week.   PHYSICAL EXAMINATION:  Vital Signs: Filed Vitals:   01/19/16 1032  BP: 116/69  Pulse: 87  Temp: 98.7 F (37.1 C)  Resp: 18   ECOG Performance Status: 0  General: Well-nourished, well-appearing female in no acute distress.  She is unaccompanied in clinic today.   HEENT: Head is atraumatic and normocephalic.  Pupils equal and reactive to light and accomodation. Conjunctivae clear without exudate.  Sclerae anicteric. Oral mucosa is pink, moist, and intact without lesions.  Oropharynx is pink without lesions or erythema.  Lymph: No cervical, supraclavicular, infraclavicular, or axillary lymphadenopathy noted on palpation.  Cardiovascular: Regular rate and rhythm without murmurs, rubs, or gallops. Respiratory: Clear to auscultation bilaterally. Chest expansion symmetric without accessory muscle use on inspiration or expiration.  GI: Abdomen soft and round. No tenderness to palpation. Bowel sounds normoactive in 4 quadrants. GU:  Deferred.  Musculoskeletal: Left lower leg +1 swelling from knee to ankle, larger than right leg. Muscle strength 5/5 in all extremities.  Pulses intact. Neuro: No focal deficits. Steady gait.  Psych: Mood and affect normal and appropriate for situation.  Extremities: No edema, cyanosis, or clubbing.  Skin: Warm and dry. No open lesions noted.   LABORATORY DATA:  None for this visit.  DIAGNOSTIC IMAGING:  Doppler pending at time of dictation.    ASSESSMENT AND PLAN:   1. 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  Ms. Purington is doing well without clinical symptoms worrisome for disease recurrence. She will follow-up with her medical oncologist,  Dr. Jana Hakim, in August 2017 with history and physical examination per surveillance protocol.  She will continue her anti-estrogen therapy with tamoxifen as prescribed by Dr. Jana Hakim at this time. If she is found to have a lower extremity blood clot (problem #2 below), then we will discontinue this therapy. She was instructed to make Dr. Jana Hakim or myself aware if she begins to experience any new or increased side effects of the medication and I could see her back in clinic to help manage those side effects, as needed. Though the incidence is low, there is an associated risk of endometrial cancer with anti-estrogen therapies like Tamoxifen.  Ms. Mosco was encouraged to contact Dr. Jana Hakim or myself with any vaginal bleeding while taking Tamoxifen.  She is still menstruating.  Other side effects of Tamoxifen were again reviewed with her as well. A comprehensive survivorship care plan and treatment summary was reviewed with the patient today detailing her breast cancer diagnosis, treatment course, potential late/long-term effects of treatment, appropriate follow-up care with recommendations for the future, and patient education resources.  A copy of this summary, along with a letter will be sent to the patient's primary care provider via in basket message after today's visit.  Ms. Christiana is welcome to return to the Survivorship Clinic in the future, as needed; no follow-up will be scheduled at this time.    2. Leg swelling: I believe that Ms. Brede's lower left leg swelling is most likely related to her RA and having recently come off prednisone.  However, as she states that it may be a bit worse with the initiation of her tamoxifen and is accompanied by calf pain / tightness, I believe it prudent to evaluate it for any evidence of clot. We will obtain a lower left leg  extremity doppler today following her appointment.  If negative, we will continue to monitor in conjunction with her rheumatologist and if positive, initiate treatment accordingly.  3. Cancer screening:  Due to Ms. Cordon's history and her age, she should receive screening for skin cancers, colon cancer, and gynecologic cancers.  The information and recommendations are listed on the patient's comprehensive care plan/treatment summary and were reviewed in detail with the patient.    4. Health maintenance and wellness promotion: Ms. Sylvan was encouraged to consume 5-7 servings of fruits and vegetables per day. We reviewed the "Nutrition Rainbow" handout, as well as discussed recommendations to maximize nutrition and minimize recurrence, such as increased intake of fruits, vegetables, lean proteins, and minimizing the intake of red meats and processed foods.  She was also encouraged to engage in moderate to vigorous exercise for 30 minutes per  day most days of the week. We discussed the [employer] fitness program, which is designed for cancer survivors to help them become more physically fit after cancer treatments.  She was instructed to limit her alcohol consumption and continue to abstain from tobacco use.  A copy of the "Take Control of Your Health" brochure was given to her reinforcing these recommendations.   5. Support services/counseling: It is not uncommon for this period of the patient's cancer care trajectory to be one of many emotions and stressors.  We discussed an opportunity for her to participate in the next session of [facility] ("Finding Your New Normal") support group series designed for patients after they have completed treatment.  Ms. Demedeiros was encouraged to take advantage of our many other support services programs, support groups, and/or counseling in coping with her new life as a cancer survivor after completing anti-cancer treatment. She was offered support today through active listening  and expressive supportive counseling.  She was given information regarding our available services and encouraged to contact me with any questions or for help enrolling in any of our support group/programs.    A total of 45 minutes of face-to-face time was spent with this patient with greater than 50% of that time in counseling and care-coordination.   Sylvan Cheese, NP  Survivorship Program [facility] [phone]   Note: PRIMARY CARE PROVIDER Wonda Cerise, [place] [phone]

## 2016-02-27 ENCOUNTER — Ambulatory Visit: Payer: BLUE CROSS/BLUE SHIELD | Admitting: Hematology and Oncology

## 2016-02-28 ENCOUNTER — Telehealth: Payer: Self-pay | Admitting: Nurse Practitioner

## 2016-02-28 NOTE — Telephone Encounter (Signed)
Received message from patient asking for return call.  Called and spoke with patient who has had a 3 week history of menstrual bleeding with occasional clot.  Denies cramps at present. On Tamoxifen. Advised patient to call GYN's (Dr. Vania Rea) office to set up appointment for evaluation. Pt perimenopausal but has not had menses in many months.  Pt states that she will do so. Will notify pt's medical oncologist of findings.

## 2016-03-01 NOTE — Telephone Encounter (Signed)
Kim Porter, ask pt to stop tamoxifen, which can thiken endometrial lining; and find out if she wants [place] to help her get an appt w dr Stann Mainland  Also ask her to give [place] f/u next week  Thanks!

## 2016-03-02 NOTE — Telephone Encounter (Signed)
Writer called patient LVM asking for a return call regarding the menstrual bleeding.

## 2016-03-12 ENCOUNTER — Ambulatory Visit: Payer: BLUE CROSS/BLUE SHIELD | Admitting: Oncology

## 2016-03-27 ENCOUNTER — Other Ambulatory Visit: Payer: Self-pay | Admitting: Obstetrics & Gynecology

## 2016-04-24 ENCOUNTER — Encounter: Payer: Self-pay | Admitting: Adult Health

## 2016-04-24 NOTE — Progress Notes (Signed)
A birthday card was mailed to the patient today on behalf of the Survivorship Program at [facility].   Gretchen Dawson, NP Survivorship Program [facility] [phone]  

## 2016-05-29 ENCOUNTER — Other Ambulatory Visit: Payer: Self-pay | Admitting: Oncology

## 2016-06-22 ENCOUNTER — Other Ambulatory Visit: Payer: Self-pay | Admitting: Oncology

## 2016-06-22 DIAGNOSIS — Z853 Personal history of malignant neoplasm of breast: Secondary | ICD-10-CM

## 2016-07-13 ENCOUNTER — Ambulatory Visit
Admission: RE | Admit: 2016-07-13 | Discharge: 2016-07-13 | Disposition: A | Discharge status: Home / Self Care | Payer: BLUE CROSS/BLUE SHIELD | Source: Ambulatory Visit | Attending: Oncology | Admitting: Oncology

## 2016-07-13 DIAGNOSIS — Z853 Personal history of malignant neoplasm of breast: Secondary | ICD-10-CM

## 2016-08-05 NOTE — Progress Notes (Signed)
[facility]  [phone] [phone]     ID: Lamyra Malcolm DOB: 1966/08/19  MR#: [id number]  OTR#:[id number]  Patient Care Team: Jefm Petty, MD as PCP - General (Family Medicine) Vania Rea, MD as Consulting Physician (Obstetrics and Gynecology) Chauncey Cruel, MD as Consulting Physician (Oncology) Fanny Skates, MD as Consulting Physician (General Surgery) Sylvan Cheese, NP as Nurse Practitioner (Hematology and Oncology) Thea Silversmith, MD as Consulting Physician (Radiation Oncology) PCP: Wonda Cerise, MD OTHER MD: Dyke Brackett MD, Nena Polio MD, Thea Silversmith M.D.  CHIEF COMPLAINT: ductal carcinoma in situ  CURRENT TREATMENT: Tamoxifen   BREAST CANCER HISTORY: From the original intake note:  Ama had bilateral screening mammography with tomography 07/11/2015 showing calcifications in the left breast, requiring further imaging.on 07/15/2015 she underwent a diagnostic left mammography which showed the breast density to be category C. There was a group of punctate calcifications in the upper outer quadrant of the left breast measuring 1 cm.biopsy of this area 08/05/2015 showed ([id number]) ductal carcinoma in situ, high-grade, estrogen receptor 100% positive, progesterone receptor 60% positive, both with strong staining intensity.  Her subsequent history is as detailed below.  INTERVAL HISTORY: Kim Porter returns today for follow-up of her ductal carcinoma in situ. She continues on tamoxifen, with good tolerance. Hot flashes and vaginal discharge are not major concerns. She obtains a drug at a good price.  REVIEW OF SYSTEMS: Debarah tells me her rheumatoid arthritis is currently well-controlled. She is exercising 10,000 steps most days. A detailed review of systems was otherwise stable.  PAST MEDICAL HISTORY: Past Medical History:  Diagnosis Date  . Breast cancer of upper-outer quadrant of left female breast ([place])  08/12/2015  . Radiation 10/18/15-11/11/15   left breast 42.5 Gy, left breast boost 7.5 Gy  . Rheumatoid arthritis ([place])     PAST SURGICAL HISTORY: Past Surgical History:  Procedure Laterality Date  . BREAST LUMPECTOMY WITH RADIOACTIVE SEED LOCALIZATION Left 09/02/2015   Procedure: LEFT BREAST LUMPECTOMY WITH RADIOACTIVE SEED LOCALIZATION;  Surgeon: Fanny Skates, MD;  Location: [facility];  Service: General;  Laterality: Left;  . EYE SURGERY    . Lasix surgery bilateral       FAMILY HISTORY Family History  Problem Relation Age of Onset  . Breast cancer Mother 33  . Colon polyps Mother 78  . Other Sister     has had "7 lumpectomies, all benign"  . Diabetes Sister   . Cancer Maternal Grandmother     unknown primary - metastasized  . Emphysema Maternal Grandfather   . Cancer Maternal Uncle 61    unspecified type; "caused by agent orange"  . Breast cancer Other     (x2); dx. 68 and 75  . Breast cancer Cousin 66    multiple primaries; negative genetic testing   . Alzheimer's disease Other     the patient's parents are still living, in their late 89s. The patient's mother was diagnosed with breast cancer the age of 30. The maternal grandmother may have had breast cancer diagnosed at age 40. 10 of the patient's mother's maternal aunts were diagnosed with breast cancer at the ages of 73 and 87. The patient also has a second cousin diagnosed with breast cancer at age 20. This cousin has been tested for a mutation  ( extensive panel) and no mutation was found  GYNECOLOGIC HISTORY:  No LMP recorded.  Menarche age 5 , the patient is GX P0. She is still having regular periods.  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 Her husband, Liliane Channel, is a Customer service manager. They have land and keep approximately 75 horses, many of them theirs or otherwise boarded. They also have 3 dogs and one  cat.    ADVANCED DIRECTIVES:  Not in place   HEALTH MAINTENANCE: Social History  Substance Use Topics  . Smoking status: Former Smoker    Types: Cigarettes  . Smokeless tobacco: Never Used     Comment: 1 ppwk for 4 yrs in college  . Alcohol use 0.6 oz/week    1 Cans of beer per week     Comment: Drinks 1-2 beers a week     Colonoscopy:  PAP:  Bone density:  Lipid panel:  No Known Allergies  Current Outpatient Prescriptions  Medication Sig Dispense Refill  . sulfaSALAzine (AZULFIDINE) 500 MG tablet Take 1,000 mg by mouth 2 (two) times daily.    Marland Kitchen FOLIC ACID PO Take by mouth.    . methotrexate (50 MG/ML) 1 G injection Inject into the vein once. Takes 0.8 mls SQ once a week    . Omega-3 Fatty Acids (FISH OIL PO) Take 1,200 mg by mouth daily.    . tamoxifen (NOLVADEX) 20 MG tablet Take 1 tablet (20 mg total) by mouth daily. 90 tablet 4   No current facility-administered medications for this visit.     OBJECTIVE:  Middle-aged white woman Who appears stated age 50:   08/06/16 1025  BP: 133/75  Pulse: 66  Resp: 18  Temp: 98.7 F (37.1 C)     Body mass index is 33.85 kg/m.    ECOG FS:1 - Symptomatic but completely ambulatory  Sclerae unicteric, EOMs intact Oropharynx clear and moist No cervical or supraclavicular adenopathy Lungs no rales or rhonchi Heart regular rate and rhythm Abd soft, nontender, positive bowel sounds MSK no focal spinal tenderness, no upper extremity lymphedema Neuro: nonfocal, well oriented, appropriate affect Breasts: The right breast is unremarkable. The left breast is status post lumpectomy and radiation. There is no evidence of local recurrence. Left axilla is benign.  LAB RESULTS:  CMP     Component Value Date/Time   NA 139 08/16/2015 1620   K 3.6 08/16/2015 1620   CO2 23 08/16/2015 1620   GLUCOSE 104 08/16/2015 1620   BUN 11.2 08/16/2015 1620   CREATININE 0.8 08/16/2015 1620   CALCIUM 9.5 08/16/2015 1620   PROT 7.5 08/16/2015  1620   ALBUMIN 4.2 08/16/2015 1620   AST 18 08/16/2015 1620   ALT 25 08/16/2015 1620   ALKPHOS 53 08/16/2015 1620   BILITOT 0.30 08/16/2015 1620    INo results found for: SPEP, UPEP  Lab Results  Component Value Date   WBC 10.7 (H) 08/16/2015   NEUTROABS 5.9 08/16/2015   HGB 13.5 08/16/2015   HCT 39.9 08/16/2015   MCV 97.3 08/16/2015   PLT 272 08/16/2015      Chemistry      Component Value Date/Time   NA 139 08/16/2015 1620   K 3.6 08/16/2015 1620   CO2 23 08/16/2015 1620   BUN 11.2 08/16/2015 1620   CREATININE 0.8 08/16/2015 1620      Component Value Date/Time   CALCIUM 9.5 08/16/2015 1620   ALKPHOS 53 08/16/2015 1620   AST 18 08/16/2015 1620   ALT 25 08/16/2015 1620   BILITOT 0.30 08/16/2015 1620       No results found  for: LABCA2  No components found for: LABCA125  No results for input(s): INR in the last 168 hours.  Urinalysis No results found for: COLORURINE, APPEARANCEUR, LABSPEC, [place], GLUCOSEU, HGBUR, BILIRUBINUR, [place], [place], UROBILINOGEN, NITRITE, LEUKOCYTESUR  STUDIES: Mm Diag Breast Tomo Bilateral  Result Date: 07/13/2016 CLINICAL DATA:  History of treated left breast cancer, status post lumpectomy and radiation therapy in 2016. EXAM: 2D DIGITAL DIAGNOSTIC BILATERAL MAMMOGRAM WITH CAD AND ADJUNCT TOMO COMPARISON:  New postsurgical baseline. ACR Breast Density Category c: The breast tissue is heterogeneously dense, which may obscure small masses. FINDINGS: Mammographically, there are no suspicious masses, areas of nonsurgical architectural distortion or microcalcifications in either breast. Mammographic images were processed with CAD. IMPRESSION: No mammographic evidence of malignancy in either breast. RECOMMENDATION: Diagnostic mammogram is suggested in 1 year. (Code:DM-B-01Y) I have discussed the findings and recommendations with the patient. Results were also provided in writing at the conclusion of the visit. If applicable, a reminder  letter will be sent to the patient regarding the next appointment. BI-RADS CATEGORY  2: Benign. Electronically Signed   By: Fidela Salisbury M.D.   On: 07/13/2016 11:45    ASSESSMENT: 49 y.o. [place] woman status post left breast upper outer quadrant biopsy 08/05/2015 for ductal carcinoma in situ, high-grade, estrogen and progesterone receptor positive   (1) genetics testing 09/05/2015 through the Breast/Ovarian Cancer Panel offered by [facility] found no deleterious mutations or variants of uncertain significance in ATM, BARD1, BRCA1, BRCA2, BRIP1, CDH1, CHEK2, FANCC, MLH1, MSH2, MSH6, NBN, PALB2, PMS2, PTEN, RAD51C, RAD51D, TP53, and XRCC2.  This panel also includes deletion/duplication analysis (without sequencing) for one gene, EPCAM.    (2) left lumpectomy 09/02/2015 showed residual high-grade ductal carcinoma in situ, with negative and ample margins  (3) adjuvant radiation 10/18/15-11/11/15 ;  Left breast/ 42.5 Gy at 2.5 Gy per fraction x 17 fractions.  Left breast boost/ 7.5 Gy at 2.5 Gy per fraction x 3 fractions   (4) tamoxifen to start January 2017  (a) Developed menstrual bleeding 02/28/2016--tamoxifen held pending gynecologic evaluation     PLAN: Ruba is now just about a year out from definitive surgery for her noninvasive breast cancer. There is no evidence of disease activity. This is favorable.  She is tolerating tamoxifen well, and the plan will be to continue that for a total of 5 years.  She will see me again in September 2018, after her next mammogram. She knows to call for any problems that may develop before that visit  Chauncey Cruel, MD   08/06/2016 8:30 PM Medical Oncology and Hematology [facility] [place], [place] [address] Tel. [phone]    Fax. [phone] this is Dr. Jana Hakim Menstrual I I don't mind 1.4 that's fine. They were going to continue place thank you for by

## 2016-08-06 ENCOUNTER — Telehealth: Payer: Self-pay | Admitting: Oncology

## 2016-08-06 ENCOUNTER — Ambulatory Visit (HOSPITAL_BASED_OUTPATIENT_CLINIC_OR_DEPARTMENT_OTHER): Payer: BLUE CROSS/BLUE SHIELD | Admitting: Oncology

## 2016-08-06 VITALS — BP 133/75 | HR 66 | Temp 98.7°F | Resp 18 | Ht 70.0 in | Wt 235.9 lb

## 2016-08-06 DIAGNOSIS — D0512 Intraductal carcinoma in situ of left breast: Secondary | ICD-10-CM | POA: Diagnosis not present

## 2016-08-06 DIAGNOSIS — Z170 Estrogen receptor positive status [ER+]: Secondary | ICD-10-CM | POA: Diagnosis not present

## 2016-08-06 DIAGNOSIS — C50412 Malignant neoplasm of upper-outer quadrant of left female breast: Secondary | ICD-10-CM

## 2016-08-06 MED ORDER — TAMOXIFEN CITRATE 20 MG PO TABS: 20.0000 mg | ORAL_TABLET | Freq: Every day | ORAL | 4 refills | Status: DC

## 2016-08-06 NOTE — Telephone Encounter (Signed)
appt ade and avs printed

## 2016-10-22 ENCOUNTER — Ambulatory Visit: Payer: Self-pay | Admitting: Rheumatology

## 2016-11-07 ENCOUNTER — Encounter (HOSPITAL_COMMUNITY): Payer: Self-pay

## 2016-11-23 ENCOUNTER — Ambulatory Visit (INDEPENDENT_AMBULATORY_CARE_PROVIDER_SITE_OTHER): Payer: BLUE CROSS/BLUE SHIELD | Admitting: Rheumatology

## 2016-11-23 ENCOUNTER — Encounter: Payer: Self-pay | Admitting: Rheumatology

## 2016-11-23 VITALS — BP 118/78 | HR 87 | Resp 14 | Ht 70.0 in | Wt 249.0 lb

## 2016-11-23 DIAGNOSIS — M19042 Primary osteoarthritis, left hand: Secondary | ICD-10-CM

## 2016-11-23 DIAGNOSIS — M65341 Trigger finger, right ring finger: Secondary | ICD-10-CM | POA: Diagnosis not present

## 2016-11-23 DIAGNOSIS — C50412 Malignant neoplasm of upper-outer quadrant of left female breast: Secondary | ICD-10-CM

## 2016-11-23 DIAGNOSIS — M25462 Effusion, left knee: Secondary | ICD-10-CM

## 2016-11-23 DIAGNOSIS — M7661 Achilles tendinitis, right leg: Secondary | ICD-10-CM

## 2016-11-23 DIAGNOSIS — M0609 Rheumatoid arthritis without rheumatoid factor, multiple sites: Secondary | ICD-10-CM

## 2016-11-23 DIAGNOSIS — M170 Bilateral primary osteoarthritis of knee: Secondary | ICD-10-CM | POA: Insufficient documentation

## 2016-11-23 DIAGNOSIS — Z170 Estrogen receptor positive status [ER+]: Secondary | ICD-10-CM

## 2016-11-23 DIAGNOSIS — Z79899 Other long term (current) drug therapy: Secondary | ICD-10-CM

## 2016-11-23 DIAGNOSIS — M19041 Primary osteoarthritis, right hand: Secondary | ICD-10-CM

## 2016-11-23 LAB — CBC WITH DIFFERENTIAL/PLATELET
BASOS ABS: 87 {cells}/uL (ref 0–200)
Basophils Relative: 1 %
EOS ABS: 348 {cells}/uL (ref 15–500)
EOS PCT: 4 %
HCT: 38.3 % (ref 35.0–45.0)
Hemoglobin: 12.4 g/dL (ref 11.7–15.5)
LYMPHS PCT: 25 %
Lymphs Abs: 2175 cells/uL (ref 850–3900)
MCH: 32.3 pg (ref 27.0–33.0)
MCHC: 32.4 g/dL (ref 32.0–36.0)
MCV: 99.7 fL (ref 80.0–100.0)
MONOS PCT: 6 %
MPV: 11.3 fL (ref 7.5–12.5)
Monocytes Absolute: 522 cells/uL (ref 200–950)
NEUTROS ABS: 5568 {cells}/uL (ref 1500–7800)
Neutrophils Relative %: 64 %
PLATELETS: 328 10*3/uL (ref 140–400)
RBC: 3.84 MIL/uL (ref 3.80–5.10)
RDW: 13.6 % (ref 11.0–15.0)
WBC: 8.7 10*3/uL (ref 3.8–10.8)

## 2016-11-23 LAB — COMPLETE METABOLIC PANEL WITH GFR
ALT: 24 U/L (ref 6–29)
AST: 26 U/L (ref 10–35)
Albumin: 4.4 g/dL (ref 3.6–5.1)
Alkaline Phosphatase: 53 U/L (ref 33–115)
BILIRUBIN TOTAL: 0.3 mg/dL (ref 0.2–1.2)
BUN: 11 mg/dL (ref 7–25)
CHLORIDE: 108 mmol/L (ref 98–110)
CO2: 23 mmol/L (ref 20–31)
Calcium: 9.2 mg/dL (ref 8.6–10.2)
Creat: 0.69 mg/dL (ref 0.50–1.10)
GLUCOSE: 89 mg/dL (ref 65–99)
POTASSIUM: 4.3 mmol/L (ref 3.5–5.3)
SODIUM: 141 mmol/L (ref 135–146)
Total Protein: 7.3 g/dL (ref 6.1–8.1)

## 2016-11-23 MED ORDER — TRIAMCINOLONE ACETONIDE 40 MG/ML IJ SUSP
10.0000 mg | INTRAMUSCULAR | Status: AC | PRN
  Administered 2016-11-23: 10 mg

## 2016-11-23 MED ORDER — LIDOCAINE HCL 1 % IJ SOLN
0.5000 mL | INTRAMUSCULAR | Status: AC | PRN
  Administered 2016-11-23: .5 mL

## 2016-11-23 NOTE — Progress Notes (Signed)
Office Visit Note  Patient: Kim Porter             Date of Birth: [birth year]           MRN: [id number]             PCP: Wonda Cerise, MD Referring: Jefm Petty, MD Visit Date: 11/23/2016 Occupation: Real estate agent    Subjective:  Pain of the Right Hand (Patient states she is having trouble with her 4th finger on her right hans. )   History of Present Illness: Kim Porter is a 50 y.o. female with rheumatoid arthritis. She states she's been having pain and discomfort in her right ring finger which is been locking off-and-on. She's been on combination of methotrexate and sulfasalazine for 1 year now. She was on Humira in the past which worked very well and was discontinued in 2016. She states she continues to have some joint discomfort especially in her elbows and hands. Some knee joint discomfort. Left knee joint is better after the cortisone injection done during the last visit in June.  Activities of Daily Living:  Patient reports morning stiffness for 1 hour.   Patient Reports nocturnal pain.  Difficulty dressing/grooming: Denies Difficulty climbing stairs: Reports Difficulty getting out of chair: Reports Difficulty using hands for taps, buttons, cutlery, and/or writing: Reports   Review of Systems  Constitutional: Negative for fatigue, night sweats, weight gain, weight loss and weakness.  HENT: Negative for mouth sores, trouble swallowing, trouble swallowing, mouth dryness and nose dryness.   Eyes: Negative for pain, redness, visual disturbance and dryness.  Respiratory: Negative for cough, shortness of breath and difficulty breathing.   Cardiovascular: Negative for chest pain, palpitations, hypertension, irregular heartbeat and swelling in legs/feet.  Gastrointestinal: Negative for blood in stool, constipation and diarrhea.  Endocrine: Negative for increased urination.  Genitourinary: Negative for vaginal dryness.  Musculoskeletal: Positive for arthralgias, joint  pain and morning stiffness. Negative for joint swelling, myalgias, muscle weakness, muscle tenderness and myalgias.  Skin: Negative for color change, rash, hair loss, skin tightness, ulcers and sensitivity to sunlight.  Allergic/Immunologic: Negative for susceptible to infections.  Neurological: Negative for dizziness, memory loss and night sweats.  Hematological: Negative for swollen glands.  Psychiatric/Behavioral: Negative for depressed mood and sleep disturbance. The patient is not nervous/anxious.     PMFS History:  Patient Active Problem List   Diagnosis Date Noted  . Effusion, left knee 11/23/2016  . High risk medication use 11/23/2016  . Trigger ring finger of right hand 11/23/2016  . History of right Achilles tendinitis 11/23/2016  . Primary osteoarthritis of both knees 11/23/2016  . Primary osteoarthritis of both hands 11/23/2016  . Genetic testing 09/06/2015  . Family history of breast cancer in mother 08/25/2015  . Family history of breast cancer in female 08/25/2015  . Rheumatoid arthritis ([place]) 08/16/2015  . Breast cancer of upper-outer quadrant of left female breast ([place]) 08/12/2015    Past Medical History:  Diagnosis Date  . Breast cancer of upper-outer quadrant of left female breast ([place]) 08/12/2015  . Radiation 10/18/15-11/11/15   left breast 42.5 Gy, left breast boost 7.5 Gy  . Rheumatoid arthritis ([place])     Family History  Problem Relation Age of Onset  . Breast cancer Mother 30  . Colon polyps Mother 110  . Other Sister     has had "7 lumpectomies, all benign"  . Diabetes Sister   . Cancer Maternal Grandmother     unknown primary - metastasized  .  Emphysema Maternal Grandfather   . Cancer Maternal Uncle 61    unspecified type; "caused by agent orange"  . Breast cancer Other     (x2); dx. 52 and 75  . Breast cancer Cousin 66    multiple primaries; negative genetic testing   . Alzheimer's disease Other    Past Surgical History:  Procedure Laterality Date   . BREAST LUMPECTOMY WITH RADIOACTIVE SEED LOCALIZATION Left 09/02/2015   Procedure: LEFT BREAST LUMPECTOMY WITH RADIOACTIVE SEED LOCALIZATION;  Surgeon: Fanny Skates, MD;  Location: [facility];  Service: General;  Laterality: Left;  . EYE SURGERY    . Lasix surgery bilateral      Social History   Social History Narrative  . No narrative on file     Objective: Vital Signs: BP 118/78 (BP Location: Left Arm, Patient Position: Sitting, Cuff Size: Large)   Pulse 87   Resp 14   Ht 5\' 10"  (1.778 m)   Wt 249 lb (112.9 kg)   BMI 35.73 kg/m    Physical Exam  Constitutional: She is oriented to person, place, and time. She appears well-developed and well-nourished.  HENT:  Head: Normocephalic and atraumatic.  Eyes: Conjunctivae and EOM are normal.  Neck: Normal range of motion.  Cardiovascular: Normal rate, regular rhythm, normal heart sounds and intact distal pulses.   Pulmonary/Chest: Effort normal and breath sounds normal.  Abdominal: Soft. Bowel sounds are normal.  Lymphadenopathy:    She has no cervical adenopathy.  Neurological: She is alert and oriented to person, place, and time.  Skin: Skin is warm and dry. Capillary refill takes less than 2 seconds.  Psychiatric: She has a normal mood and affect. Her behavior is normal.  Nursing note and vitals reviewed.    Musculoskeletal Exam: C-spine, thoracic, lumbar spine good range of motion. She has good range of motion of her shoulders elbows wrists joint MCPs PIPs DIPs with no synovitis. She has right fourth trigger finger with flexor tendon thickening. Hip joints knee joints ankles MTPs PIPs with good range of motion with no synovitis.  CDAI Exam: CDAI Homunculus Exam:   Joint Counts:  CDAI Tender Joint count: 0 CDAI Swollen Joint count: 0  Global Assessments:  Patient Global Assessment: 6 Provider Global Assessment: 4  CDAI Calculated Score: 10    Investigation: Findings:  July 2015 TB Gold negative,  chest x-ray 2014 normal hepatitis panel July 2014 negative G6PD May 2016 normal, 2014 patient had pneumococcal vaccine and zoster vaccine 08/01/2016 CBC normal, CMP normal    Imaging: No results found.  Speciality Comments: No specialty comments available.    Procedures:  Hand/UE Inj Date/Time: 11/23/2016 10:58 AM Performed by: Bo Merino Authorized by: Bo Merino   Consent Given by:  Patient Site marked: the procedure site was marked   Timeout: prior to procedure the correct patient, procedure, and site was verified   Indications:  Therapeutic and tendon swelling Condition: trigger finger   Location:  Ring finger Site:  R ring A1 Prep: patient was prepped and draped in usual sterile fashion   Needle Size:  27 G Approach:  Volar Ultrasound Guidance: Yes   Medications:  0.5 mL lidocaine 1 %; 10 mg triamcinolone acetonide 40 MG/ML Aspirate amount (mL):  0 Patient tolerance:  Patient tolerated the procedure well with no immediate complications   Allergies: Patient has no known allergies.   Assessment / Plan:     Visit Diagnoses: Rheumatoid arthritis of multiple sites with negative rheumatoid factor ([place]) - +  14 3 3eta, positive synovitis on ultrasound: She continues to have some arthralgias despite taking methotrexate and sulfasalazine.  Effusion, left knee - Recurrent: Doing better after last cortisone injection to her knee joint.  High risk medication use -she is generally well on Humira in the past which was discontinued due to history of breast cancer. Azulfidine EN,500 mg 2 tablets twice a day, methotrexate 0.8 ML subcutaneous every week, folic acid 2 mg by mouth daily - Plan: Her labs are past-due we will do labs today and then every 3 months to monitor for drug toxicity. CBC with Differential/Platelet, COMPLETE METABOLIC PANEL WITH GFR, CBC with Differential/Platelet, COMPLETE METABOLIC PANEL WITH GFR  Trigger ring finger of right hand: After informed  consent was obtained in different treatment options were discussed the right fourth finger was injected with cortisone procedures described above she tolerated the procedure well.  History of right Achilles tendinitis: No recurrence  Primary osteoarthritis of both knees - Bilateral moderate  Primary osteoarthritis of both hands: Chronic discomfort  Malignant neoplasm of upper-outer quadrant of left breast in female, estrogen receptor positive [facility]) - September 2016, partial mastectomy, radiation therapy for 1 month, on tamoxifen followed up by Dr. Jana Hakim    Orders: Orders Placed This Encounter  Procedures  . CBC with Differential/Platelet  . COMPLETE METABOLIC PANEL WITH GFR  . CBC with Differential/Platelet  . COMPLETE METABOLIC PANEL WITH GFR   No orders of the defined types were placed in this encounter.   Face-to-face time spent with patient was 35 minutes. 50% of time was spent in counseling and coordination of care.  Follow-Up Instructions: Return in about 5 months (around 04/23/2017) for Rheumatoid arthritis.   Bo Merino, MD

## 2016-11-23 NOTE — Patient Instructions (Signed)
Standing Labs We placed an order today for your standing lab work.    Please come back and get your standing labs in March and then every 3 months  We have open lab Monday through Friday from 8:30-11:30 AM and 1:30-4 PM at the office of Dr. Tresa Moore, PA.   The office is located at [address], [place], [place], [place] [address] No appointment is necessary.   Labs are drawn by [facility].  You may receive a bill from [facility] for your lab work.

## 2016-11-26 NOTE — Progress Notes (Signed)
Labs normal.

## 2016-11-29 ENCOUNTER — Other Ambulatory Visit: Payer: Self-pay | Admitting: *Deleted

## 2016-11-29 MED ORDER — "TUBERCULIN-ALLERGY SYRINGES 27G X 1/2"" 1 ML KIT": PACK | 0 refills | Status: DC

## 2016-11-29 MED ORDER — METHOTREXATE SODIUM CHEMO INJECTION 50 MG/2ML: 20.0000 mg | INTRAMUSCULAR | 0 refills | Status: DC

## 2016-11-29 MED ORDER — SULFASALAZINE 500 MG PO TABS: 1000.0000 mg | ORAL_TABLET | Freq: Two times a day (BID) | ORAL | 0 refills | Status: DC

## 2016-11-29 MED ORDER — FOLIC ACID 1 MG PO TABS: 2.0000 mg | ORAL_TABLET | Freq: Every day | ORAL | 3 refills | Status: DC

## 2016-11-29 NOTE — Telephone Encounter (Signed)
ok 

## 2016-11-29 NOTE — Telephone Encounter (Signed)
Patient is Requesting medication refills on SSZ, Folic Acid, MTX and syringes.  Last Visit: 11/23/16 Next Visit: 04/23/17 Labs: 11/23/16  Okay to refill SSZ, Folic Acid, MTX and syringes?

## 2016-12-15 ENCOUNTER — Other Ambulatory Visit: Payer: Self-pay | Admitting: Rheumatology

## 2016-12-17 NOTE — Telephone Encounter (Signed)
Last Visit: 11/23/16 Next Visit: 04/23/17 Labs: 11/23/16 WNL  Okay to refill Folic Acid?

## 2016-12-17 NOTE — Telephone Encounter (Signed)
ok 

## 2017-01-08 ENCOUNTER — Ambulatory Visit: Payer: Self-pay | Admitting: Rheumatology

## 2017-01-25 ENCOUNTER — Encounter (HOSPITAL_COMMUNITY): Payer: Self-pay

## 2017-03-04 ENCOUNTER — Other Ambulatory Visit: Payer: Self-pay | Admitting: Rheumatology

## 2017-03-04 NOTE — Telephone Encounter (Signed)
Labs due now, ok

## 2017-03-04 NOTE — Telephone Encounter (Signed)
Last Visit: 11/23/16 Next Visit: 04/23/17 Labs: 11/23/16 WNL Patient will update labs tomorrow.   Okay to refill MTX?

## 2017-03-05 ENCOUNTER — Other Ambulatory Visit: Payer: Self-pay | Admitting: *Deleted

## 2017-03-05 DIAGNOSIS — Z79899 Other long term (current) drug therapy: Secondary | ICD-10-CM

## 2017-03-05 LAB — CBC WITH DIFFERENTIAL/PLATELET
BASOS ABS: 124 {cells}/uL (ref 0–200)
Basophils Relative: 2 %
EOS PCT: 5 %
Eosinophils Absolute: 310 cells/uL (ref 15–500)
HEMATOCRIT: 37.6 % (ref 35.0–45.0)
HEMOGLOBIN: 12 g/dL (ref 11.7–15.5)
LYMPHS ABS: 1426 {cells}/uL (ref 850–3900)
Lymphocytes Relative: 23 %
MCH: 32.3 pg (ref 27.0–33.0)
MCHC: 31.9 g/dL — AB (ref 32.0–36.0)
MCV: 101.1 fL — ABNORMAL HIGH (ref 80.0–100.0)
MONO ABS: 372 {cells}/uL (ref 200–950)
MPV: 11.4 fL (ref 7.5–12.5)
Monocytes Relative: 6 %
NEUTROS PCT: 64 %
Neutro Abs: 3968 cells/uL (ref 1500–7800)
Platelets: 299 10*3/uL (ref 140–400)
RBC: 3.72 MIL/uL — ABNORMAL LOW (ref 3.80–5.10)
RDW: 13.6 % (ref 11.0–15.0)
WBC: 6.2 10*3/uL (ref 3.8–10.8)

## 2017-03-06 ENCOUNTER — Other Ambulatory Visit: Payer: Self-pay | Admitting: *Deleted

## 2017-03-06 LAB — COMPLETE METABOLIC PANEL WITH GFR
ALBUMIN: 4 g/dL (ref 3.6–5.1)
ALK PHOS: 50 U/L (ref 33–115)
ALT: 17 U/L (ref 6–29)
AST: 19 U/L (ref 10–35)
BILIRUBIN TOTAL: 0.4 mg/dL (ref 0.2–1.2)
BUN: 10 mg/dL (ref 7–25)
CALCIUM: 9.3 mg/dL (ref 8.6–10.2)
CO2: 21 mmol/L (ref 20–31)
Chloride: 108 mmol/L (ref 98–110)
Creat: 0.75 mg/dL (ref 0.50–1.10)
GFR, Est African American: >89 mL/min (ref >=60)
GLUCOSE: 128 mg/dL — AB (ref 65–99)
POTASSIUM: 4.6 mmol/L (ref 3.5–5.3)
SODIUM: 139 mmol/L (ref 135–146)
TOTAL PROTEIN: 6.9 g/dL (ref 6.1–8.1)

## 2017-03-06 NOTE — Progress Notes (Signed)
Glu mildly elvated

## 2017-03-06 NOTE — Telephone Encounter (Signed)
Last Visit: 11/23/16 Next Visit: 04/23/17  Okay to refill syringes?

## 2017-03-06 NOTE — Telephone Encounter (Signed)
ok 

## 2017-03-07 MED ORDER — "TUBERCULIN SYRINGE 27G X 1/2"" 1 ML MISC": 0 refills | Status: DC

## 2017-03-18 ENCOUNTER — Other Ambulatory Visit: Payer: Self-pay | Admitting: Obstetrics & Gynecology

## 2017-03-20 LAB — CYTOLOGY - PAP

## 2017-04-17 NOTE — Progress Notes (Deleted)
Office Visit Note  Patient: Kim Porter             Date of Birth: 03-21-1966           MRN: [id number]             PCP: Jefm Petty, MD Referring: Jefm Petty, MD Visit Date: 04/23/2017 Occupation: '@GUAROCC' @    Subjective:  No chief complaint on file.   History of Present Illness: Kim Porter is a 51 y.o. female ***   Activities of Daily Living:  Patient reports morning stiffness for *** {minute/hour:19697}.   Patient {ACTIONS;DENIES/REPORTS:21021675::"Denies"} nocturnal pain.  Difficulty dressing/grooming: {ACTIONS;DENIES/REPORTS:21021675::"Denies"} Difficulty climbing stairs: {ACTIONS;DENIES/REPORTS:21021675::"Denies"} Difficulty getting out of chair: {ACTIONS;DENIES/REPORTS:21021675::"Denies"} Difficulty using hands for taps, buttons, cutlery, and/or writing: {ACTIONS;DENIES/REPORTS:21021675::"Denies"}   No Rheumatology ROS completed.   PMFS History:  Patient Active Problem List   Diagnosis Date Noted  . Effusion, left knee 11/23/2016  . High risk medication use 11/23/2016  . Trigger ring finger of right hand 11/23/2016  . History of right Achilles tendinitis 11/23/2016  . Primary osteoarthritis of both knees 11/23/2016  . Primary osteoarthritis of both hands 11/23/2016  . Genetic testing 09/06/2015  . Family history of breast cancer in mother 08/25/2015  . Family history of breast cancer in female 08/25/2015  . Rheumatoid arthritis ([place]) 08/16/2015  . Breast cancer of upper-outer quadrant of left female breast ([place]) 08/12/2015    Past Medical History:  Diagnosis Date  . Breast cancer of upper-outer quadrant of left female breast ([place]) 08/12/2015  . Radiation 10/18/15-11/11/15   left breast 42.5 Gy, left breast boost 7.5 Gy  . Rheumatoid arthritis ([place])     Family History  Problem Relation Age of Onset  . Breast cancer Mother 60  . Colon polyps Mother 7  . Other Sister        has had "7 lumpectomies, all benign"  . Diabetes Sister   . Cancer  Maternal Grandmother        unknown primary - metastasized  . Emphysema Maternal Grandfather   . Cancer Maternal Uncle 61       unspecified type; "caused by agent orange"  . Breast cancer Other        (x2); dx. 79 and 75  . Breast cancer Cousin 66       multiple primaries; negative genetic testing   . Alzheimer's disease Other    Past Surgical History:  Procedure Laterality Date  . BREAST LUMPECTOMY WITH RADIOACTIVE SEED LOCALIZATION Left 09/02/2015   Procedure: LEFT BREAST LUMPECTOMY WITH RADIOACTIVE SEED LOCALIZATION;  Surgeon: Fanny Skates, MD;  Location: [facility];  Service: General;  Laterality: Left;  . EYE SURGERY    . Lasix surgery bilateral      Social History   Social History Narrative  . No narrative on file     Objective: Vital Signs: There were no vitals taken for this visit.   Physical Exam   Musculoskeletal Exam: ***  CDAI Exam: No CDAI exam completed.    Investigation: Findings:  06/30/2013 Since she was having a lot of discomfort and pain in her hand, to rule out synovitis we obtained an ultrasound of her right hand.  After informed consent was obtained, per EULAR recommendations, ultrasound examination of the right hand was performed using grayscale and 12 MHz transducer and also power Doppler to look for synovitis or tenosynovitis.  We evaluated the right 1st, 2nd, 3rd, 4th, and 5th MCP joints and also the wrist joint, dorsal and  volar aspect.  The findings were the right 1st, 2nd, 3rd, and 4th MCP joints showed synovitis.  The 4th MCP joint also showed tenosynovitis.  There was not much synovitis or synovial thickening in the 5th MCP joint.  Wrist joint, both on the radial and the ulnar side, showed some synovitis without any erosive changes.  No tenosynovitis in the wrist joint was noted.  Median nerve was 0.09 cm square.  So the findings showed synovitis and tenosynovitis in the MCP joints without any erosive changes.  These findings were  consistent with rheumatoid arthritis, active disease.  Labs from 06/30/2013 show sed rate normal, hep panel negative, CK negative, ACE negative, IgG and IgA are normal, IgM is low at 46, but within normal limits.  HIV is negative.  Rheumatoid factor negative.  ANA negative.  CCP negative.  SPEP, M-spike is negative.  ENA is normal.  HLA-B27 is negative.   TB gold negative 07/31/2016   03/05/2017 CBC normal, CMP normal glucose 128  Imaging: No results found.  Speciality Comments: No specialty comments available.    Procedures:  No procedures performed Allergies: Patient has no known allergies.   Assessment / Plan:     Visit Diagnoses: Rheumatoid arthritis of multiple sites with negative rheumatoid factor (HCC) - Positive synovitis on ultrasound  High risk medication use - Methotrexate 20 mg subcutaneous every week, folic acid 2 mg by mouth daily, sulfasalazine 500 mg 2 tablets by mouth twice a day  Primary osteoarthritis of both knees  Effusion, left knee  Primary osteoarthritis of both hands  History of right Achilles tendinitis  History of breast cancer - On tamoxifen    Orders: No orders of the defined types were placed in this encounter.  No orders of the defined types were placed in this encounter.   Face-to-face time spent with patient was *** minutes. 50% of time was spent in counseling and coordination of care.  Follow-Up Instructions: No Follow-up on file.   Bo Merino, MD  Note - This record has been created using Editor, commissioning.  Chart creation errors have been sought, but may not always  have been located. Such creation errors do not reflect on  the standard of medical care.

## 2017-04-23 ENCOUNTER — Ambulatory Visit: Payer: BLUE CROSS/BLUE SHIELD | Admitting: Rheumatology

## 2017-04-26 NOTE — Progress Notes (Signed)
Office Visit Note  Patient: Kim Porter             Date of Birth: [birth year]           MRN: [id number]             PCP: Jefm Petty, MD Referring: Jefm Petty, MD Visit Date: 05/10/2017 Occupation: @GUAROCC @    Subjective:  Right ankle swelling.   History of Present Illness: Kim Porter is a 51 y.o. female with seronegative rheumatoid arthritis. She's been on combination of methotrexate and sulfasalazine. She states she's been having some pain and stiffness in her hands with intermittent swelling her right ankle joint has been swollen lately. She has some discomfort in her right knee.   Activities of Daily Living:  Patient reports morning stiffness for 1 hour.   Patient Reports nocturnal pain.  Difficulty dressing/grooming: Denies Difficulty climbing stairs: Reports Difficulty getting out of chair: Reports Difficulty using hands for taps, buttons, cutlery, and/or writing: Reports   Review of Systems  Constitutional: Negative for fatigue, night sweats, weight gain, weight loss and weakness.  HENT: Negative for mouth sores, trouble swallowing, trouble swallowing, mouth dryness and nose dryness.   Eyes: Negative for pain, redness, visual disturbance and dryness.  Respiratory: Negative for cough, shortness of breath and difficulty breathing.   Cardiovascular: Negative for chest pain, palpitations, hypertension, irregular heartbeat and swelling in legs/feet.  Gastrointestinal: Negative for blood in stool, constipation and diarrhea.  Endocrine: Negative for increased urination.  Genitourinary: Negative for vaginal dryness.  Musculoskeletal: Positive for arthralgias, joint pain, joint swelling and morning stiffness. Negative for myalgias, muscle weakness, muscle tenderness and myalgias.  Skin: Negative for color change, rash, hair loss, skin tightness, ulcers and sensitivity to sunlight.  Allergic/Immunologic: Negative for susceptible to infections.  Neurological: Negative  for dizziness, memory loss and night sweats.  Hematological: Negative for swollen glands.  Psychiatric/Behavioral: Negative for depressed mood and sleep disturbance. The patient is not nervous/anxious.     PMFS History:  Patient Active Problem List   Diagnosis Date Noted  . Effusion, left knee 11/23/2016  . High risk medication use 11/23/2016  . Trigger ring finger of right hand 11/23/2016  . History of right Achilles tendinitis 11/23/2016  . Primary osteoarthritis of both knees 11/23/2016  . Primary osteoarthritis of both hands 11/23/2016  . Genetic testing 09/06/2015  . Family history of breast cancer in mother 08/25/2015  . Family history of breast cancer in female 08/25/2015  . Rheumatoid arthritis ([place]) 08/16/2015  . Breast cancer of upper-outer quadrant of left female breast ([place]) 08/12/2015    Past Medical History:  Diagnosis Date  . Breast cancer of upper-outer quadrant of left female breast ([place]) 08/12/2015  . Radiation 10/18/15-11/11/15   left breast 42.5 Gy, left breast boost 7.5 Gy  . Rheumatoid arthritis ([place])     Family History  Problem Relation Age of Onset  . Breast cancer Mother 22  . Colon polyps Mother 65  . Other Sister        has had "7 lumpectomies, all benign"  . Diabetes Sister   . Cancer Maternal Grandmother        unknown primary - metastasized  . Emphysema Maternal Grandfather   . Cancer Maternal Uncle 61       unspecified type; "caused by agent orange"  . Breast cancer Other        (x2); dx. 2 and 75  . Breast cancer Cousin 32       multiple  primaries; negative genetic testing   . Alzheimer's disease Other    Past Surgical History:  Procedure Laterality Date  . BREAST LUMPECTOMY WITH RADIOACTIVE SEED LOCALIZATION Left 09/02/2015   Procedure: LEFT BREAST LUMPECTOMY WITH RADIOACTIVE SEED LOCALIZATION;  Surgeon: Fanny Skates, MD;  Location: [facility];  Service: General;  Laterality: Left;  . EYE SURGERY    . Lasix surgery  bilateral      Social History   Social History Narrative  . No narrative on file     Objective: Vital Signs: BP 116/69   Pulse 71   Resp 14   Ht 5\' 10"  (1.778 m)   Wt 235 lb (106.6 kg)   BMI 33.72 kg/m    Physical Exam  Constitutional: She is oriented to person, place, and time. She appears well-developed and well-nourished.  HENT:  Head: Normocephalic and atraumatic.  Eyes: Conjunctivae and EOM are normal.  Neck: Normal range of motion.  Cardiovascular: Normal rate, regular rhythm, normal heart sounds and intact distal pulses.   Pulmonary/Chest: Effort normal and breath sounds normal.  Abdominal: Soft. Bowel sounds are normal.  Lymphadenopathy:    She has no cervical adenopathy.  Neurological: She is alert and oriented to person, place, and time.  Skin: Skin is warm and dry. Capillary refill takes less than 2 seconds.  Psychiatric: She has a normal mood and affect. Her behavior is normal.  Nursing note and vitals reviewed.    Musculoskeletal Exam: C-spine and thoracic lumbar spine good range of motion. Shoulder joints elbow joints wrist joint MCPs PIPs DIPs with good range of motion. Hip joints knee joints ankles MTPs PIPs DIPs with good range of motion with no synovitis.  CDAI Exam: CDAI Homunculus Exam:   Joint Counts:  CDAI Tender Joint count: 0 CDAI Swollen Joint count: 0  Global Assessments:  Patient Global Assessment: 5 Provider Global Assessment: 3  CDAI Calculated Score: 8    Investigation: Findings:  TB gold negative 07/2015   CMP Latest Ref Rng & Units 03/05/2017 11/23/2016 08/16/2015  Glucose 65 - 99 mg/dL 128(H) 89 104  BUN 7 - 25 mg/dL 10 11 11.2  Creatinine 0.50 - 1.10 mg/dL 0.75 0.69 0.8  Sodium 135 - 146 mmol/L 139 141 139  Potassium 3.5 - 5.3 mmol/L 4.6 4.3 3.6  Chloride 98 - 110 mmol/L 108 108 -  CO2 20 - 31 mmol/L 21 23 23   Calcium 8.6 - 10.2 mg/dL 9.3 9.2 9.5  Total Protein 6.1 - 8.1 g/dL 6.9 7.3 7.5  Total Bilirubin 0.2 - 1.2 mg/dL  0.4 0.3 0.30  Alkaline Phos 33 - 115 U/L 50 53 53  AST 10 - 35 U/L 19 26 18   ALT 6 - 29 U/L 17 24 25    CBC Latest Ref Rng & Units 03/05/2017 11/23/2016 08/16/2015  WBC 3.8 - 10.8 K/uL 6.2 8.7 10.7(H)  Hemoglobin 11.7 - 15.5 g/dL 12.0 12.4 13.5  Hematocrit 35.0 - 45.0 % 37.6 38.3 39.9  Platelets 140 - 400 K/uL 299 328 272   Imaging: No results found.  Speciality Comments: No specialty comments available.    Procedures:  No procedures performed Allergies: Patient has no known allergies.   Assessment / Plan:     Visit Diagnoses: Rheumatoid arthritis of multiple sites with negative rheumatoid factor (HCC)Patient continues to have some arthralgias but has no synovitis on examination today. She complains of ankle joint puffiness but had no discomfort or tenderness and believes she may have some pedal edema.  High  risk medication use - Methotrexate subcutaneous0.8 ml sq q wk, sulfasalazine 500 mg , 2 tabs po bid, folic acid 2mg  po qd - her labs have been stable we will check her labs today and then every 3 months to monitor for drug toxicity. Plan: CBC with Differential/Platelet, COMPLETE METABOLIC PANEL WITH GFR  Trigger ring finger of right hand: Better after the cortisone injection  Primary osteoarthritis of both hands: Causes some stiffness  Primary osteoarthritis of both knees: Chronic pain  History of right Achilles tendinitis: Resolved  History of breast cancer -  September 2016, partial mastectomy, radiation therapy for 1 month, on tamoxifen followed up by Dr. Jana Hakim  High risk medication use - Azulfidine EN,500 mg 2 tablets twice a day, methotrexate 0.8 ML subcutaneous every week, folic acid 2 mg by mouth daily - Plan: CBC with Differential/Platelet, COMPLETE METABOLIC PANEL WITH GFR    Orders: No orders of the defined types were placed in this encounter.  No orders of the defined types were placed in this encounter.   Face-to-face time spent with patient was 30  minutes. 50% of time was spent in counseling and coordination of care.  Follow-Up Instructions: Return in about 5 months (around 10/10/2017) for Rheumatoid arthritis.   Bo Merino, MD  Note - This record has been created using Editor, commissioning.  Chart creation errors have been sought, but may not always  have been located. Such creation errors do not reflect on  the standard of medical care.

## 2017-05-10 ENCOUNTER — Encounter: Payer: Self-pay | Admitting: Rheumatology

## 2017-05-10 ENCOUNTER — Ambulatory Visit (INDEPENDENT_AMBULATORY_CARE_PROVIDER_SITE_OTHER): Payer: BLUE CROSS/BLUE SHIELD | Admitting: Rheumatology

## 2017-05-10 VITALS — BP 116/69 | HR 71 | Resp 14 | Ht 70.0 in | Wt 235.0 lb

## 2017-05-10 DIAGNOSIS — M0609 Rheumatoid arthritis without rheumatoid factor, multiple sites: Secondary | ICD-10-CM

## 2017-05-10 DIAGNOSIS — M19042 Primary osteoarthritis, left hand: Secondary | ICD-10-CM

## 2017-05-10 DIAGNOSIS — M65341 Trigger finger, right ring finger: Secondary | ICD-10-CM

## 2017-05-10 DIAGNOSIS — M170 Bilateral primary osteoarthritis of knee: Secondary | ICD-10-CM

## 2017-05-10 DIAGNOSIS — M19041 Primary osteoarthritis, right hand: Secondary | ICD-10-CM

## 2017-05-10 DIAGNOSIS — M7661 Achilles tendinitis, right leg: Secondary | ICD-10-CM

## 2017-05-10 DIAGNOSIS — Z79899 Other long term (current) drug therapy: Secondary | ICD-10-CM

## 2017-05-10 DIAGNOSIS — Z853 Personal history of malignant neoplasm of breast: Secondary | ICD-10-CM

## 2017-05-10 LAB — CBC WITH DIFFERENTIAL/PLATELET
BASOS ABS: 81 {cells}/uL (ref 0–200)
Basophils Relative: 1 %
EOS ABS: 324 {cells}/uL (ref 15–500)
Eosinophils Relative: 4 %
HEMATOCRIT: 38.6 % (ref 35.0–45.0)
HEMOGLOBIN: 12.6 g/dL (ref 11.7–15.5)
LYMPHS ABS: 1863 {cells}/uL (ref 850–3900)
LYMPHS PCT: 23 %
MCH: 33 pg (ref 27.0–33.0)
MCHC: 32.6 g/dL (ref 32.0–36.0)
MCV: 101 fL — AB (ref 80.0–100.0)
MONO ABS: 486 {cells}/uL (ref 200–950)
MPV: 11.6 fL (ref 7.5–12.5)
Monocytes Relative: 6 %
NEUTROS PCT: 66 %
Neutro Abs: 5346 cells/uL (ref 1500–7800)
Platelets: 301 10*3/uL (ref 140–400)
RBC: 3.82 MIL/uL (ref 3.80–5.10)
RDW: 14 % (ref 11.0–15.0)
WBC: 8.1 10*3/uL (ref 3.8–10.8)

## 2017-05-10 LAB — COMPLETE METABOLIC PANEL WITH GFR
ALBUMIN: 4.2 g/dL (ref 3.6–5.1)
ALK PHOS: 56 U/L (ref 33–130)
ALT: 24 U/L (ref 6–29)
AST: 19 U/L (ref 10–35)
BUN: 11 mg/dL (ref 7–25)
CALCIUM: 9.4 mg/dL (ref 8.6–10.4)
CHLORIDE: 106 mmol/L (ref 98–110)
CO2: 23 mmol/L (ref 20–31)
CREATININE: 0.67 mg/dL (ref 0.50–1.05)
GFR, Est Non African American: >89 mL/min (ref >=60)
Glucose, Bld: 85 mg/dL (ref 65–99)
POTASSIUM: 4.7 mmol/L (ref 3.5–5.3)
Sodium: 140 mmol/L (ref 135–146)
Total Bilirubin: 0.3 mg/dL (ref 0.2–1.2)
Total Protein: 7.1 g/dL (ref 6.1–8.1)

## 2017-05-10 MED ORDER — "TUBERCULIN-ALLERGY SYRINGES 27G X 1/2"" 1 ML KIT": PACK | 0 refills | Status: DC

## 2017-05-10 NOTE — Addendum Note (Signed)
Addended by: Carole Binning on: 05/10/2017 11:21 AM   Modules accepted: Orders

## 2017-05-11 NOTE — Progress Notes (Signed)
Labs are stable.

## 2017-05-29 ENCOUNTER — Other Ambulatory Visit: Payer: Self-pay | Admitting: Rheumatology

## 2017-05-29 NOTE — Telephone Encounter (Signed)
Last Visit: 05/10/17 Next Visit: 10/10/17 Labs: 05/10/17 Stable  Okay to refill MTX and syringes?

## 2017-05-29 NOTE — Telephone Encounter (Signed)
ok 

## 2017-05-30 ENCOUNTER — Other Ambulatory Visit: Payer: Self-pay | Admitting: Rheumatology

## 2017-05-30 NOTE — Telephone Encounter (Signed)
Last Visit: 05/10/17 Next Visit: 10/10/17 Labs: 05/10/17 Stable  Okay to refill SSZ?

## 2017-05-30 NOTE — Telephone Encounter (Signed)
ok 

## 2017-06-18 ENCOUNTER — Other Ambulatory Visit: Payer: Self-pay | Admitting: Oncology

## 2017-06-18 DIAGNOSIS — Z9889 Other specified postprocedural states: Secondary | ICD-10-CM

## 2017-06-18 DIAGNOSIS — Z853 Personal history of malignant neoplasm of breast: Secondary | ICD-10-CM

## 2017-07-15 ENCOUNTER — Ambulatory Visit
Admission: RE | Admit: 2017-07-15 | Discharge: 2017-07-15 | Disposition: A | Discharge status: Home / Self Care | Payer: BLUE CROSS/BLUE SHIELD | Source: Ambulatory Visit | Attending: Oncology | Admitting: Oncology

## 2017-07-15 DIAGNOSIS — Z853 Personal history of malignant neoplasm of breast: Secondary | ICD-10-CM

## 2017-07-15 DIAGNOSIS — Z9889 Other specified postprocedural states: Secondary | ICD-10-CM

## 2017-07-15 HISTORY — DX: Personal history of irradiation: Z92.3

## 2017-08-13 ENCOUNTER — Ambulatory Visit (HOSPITAL_BASED_OUTPATIENT_CLINIC_OR_DEPARTMENT_OTHER): Payer: BLUE CROSS/BLUE SHIELD | Admitting: Oncology

## 2017-08-13 VITALS — BP 132/52 | HR 74 | Temp 98.3°F | Resp 18 | Ht 70.0 in | Wt 237.1 lb

## 2017-08-13 DIAGNOSIS — D0512 Intraductal carcinoma in situ of left breast: Secondary | ICD-10-CM | POA: Diagnosis not present

## 2017-08-13 DIAGNOSIS — N926 Irregular menstruation, unspecified: Secondary | ICD-10-CM

## 2017-08-13 DIAGNOSIS — C50412 Malignant neoplasm of upper-outer quadrant of left female breast: Secondary | ICD-10-CM

## 2017-08-13 DIAGNOSIS — Z170 Estrogen receptor positive status [ER+]: Secondary | ICD-10-CM | POA: Diagnosis not present

## 2017-08-13 DIAGNOSIS — Z79810 Long term (current) use of selective estrogen receptor modulators (SERMs): Secondary | ICD-10-CM | POA: Diagnosis not present

## 2017-08-13 NOTE — Progress Notes (Signed)
[facility]  [phone] [phone]) [phone]     ID: Kim Porter DOB: [birth year]  MR#: [id number]  CZY#:[id number]  Patient Care Team: Jefm Petty, MD as PCP - General (Family Medicine) Vania Rea, MD as Consulting Physician (Obstetrics and Gynecology) Elbridge Magowan, Virgie Dad, MD as Consulting Physician (Oncology) Fanny Skates, MD as Consulting Physician (General Surgery) Sylvan Cheese, NP as Nurse Practitioner (Hematology and Oncology) Thea Silversmith, MD (Inactive) as Consulting Physician (Radiation Oncology) PCP: Jefm Petty, MD OTHER MD: Dyke Brackett MD, Nena Polio MD, Thea Silversmith M.D.  CHIEF COMPLAINT: ductal carcinoma in situ  CURRENT TREATMENT: Tamoxifen   BREAST CANCER HISTORY: From the original intake note:  Kim Porter had bilateral screening mammography with tomography 07/11/2015 showing calcifications in the left breast, requiring further imaging.on 07/15/2015 she underwent a diagnostic left mammography which showed the breast density to be category C. 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  Her subsequent history is as detailed below.  INTERVAL HISTORY: Kim Porter returns today for her estrogen receptor positive ductal carcinoma in situ. She continues on tamoxifen, with good tolerance. She "wouldn't know on taking it if I didn't know I was taking it". She does have irregular periods and they tend to be a little heavier than they used to be. This possibly could be related to the tamoxifen although it could just be emerging menopause.  REVIEW OF SYSTEMS: Kim Porter continues to ride horses and walk for exercise. Her rheumatoid arthritis is well-controlled. A detailed review  of systems today was otherwise stable  PAST MEDICAL HISTORY: Past Medical History:  Diagnosis Date  . Breast cancer of upper-outer quadrant of left female breast ([place]) 08/12/2015  . Personal history of radiation therapy 2016  . Radiation 10/18/15-11/11/15   left breast 42.5 Gy, left breast boost 7.5 Gy  . Rheumatoid arthritis ([place])     PAST SURGICAL HISTORY: Past Surgical History:  Procedure Laterality Date  . BREAST LUMPECTOMY Left 2016  . BREAST LUMPECTOMY WITH RADIOACTIVE SEED LOCALIZATION Left 09/02/2015   Procedure: LEFT BREAST LUMPECTOMY WITH RADIOACTIVE SEED LOCALIZATION;  Surgeon: Fanny Skates, MD;  Location: [facility];  Service: General;  Laterality: Left;  . EYE SURGERY    . Lasix surgery bilateral       FAMILY HISTORY Family History  Problem Relation Age of Onset  . Breast cancer Mother 59  . Colon polyps Mother 60  . Other Sister        has had "7 lumpectomies, all benign"  . Diabetes Sister   . Cancer Maternal Grandmother        unknown primary - metastasized  . Emphysema Maternal Grandfather   . Cancer Maternal Uncle 61       unspecified type; "caused by agent orange"  . Breast cancer Other        (x2); dx. 75 and 75  . Breast cancer Cousin 66       multiple primaries; negative genetic testing   . Alzheimer's disease Other     the patient's parents are still living, in their late 70s. The patient's mother was diagnosed with breast cancer the age of 1. The maternal grandmother may have had breast cancer diagnosed at age 92. 38 of the patient's mother's maternal aunts were diagnosed with breast cancer at the  ages of 61 and 22. The patient also has a second cousin diagnosed with breast cancer at age 66. This cousin has been tested for a mutation  ( extensive panel) and no mutation was found  GYNECOLOGIC HISTORY:  No LMP recorded. Patient is not currently having periods (Reason: Irregular Periods).  Menarche age 26 , the patient is GX P0. She is  still having regular periods. 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 Her husband, Kim Porter, is a Customer service manager. They have land and keep approximately 75 horses, many of them theirs or otherwise boarded. They also have 3 dogs and one cat.    ADVANCED DIRECTIVES:  Not in place   HEALTH MAINTENANCE: Social History  Substance Use Topics  . Smoking status: Former Smoker    Types: Cigarettes  . Smokeless tobacco: Never Used     Comment: 1 ppwk for 4 yrs in college  . Alcohol use 0.6 - 1.2 oz/week    1 - 2 Cans of beer per week     Colonoscopy:  PAP:  Bone density:  Lipid panel:  No Known Allergies  Current Outpatient Prescriptions  Medication Sig Dispense Refill  . B-D ALLERGY SYRINGE 1CC/28G 28G X 1/2" 1 ML MISC USE 1 SYRINGE ONCE A WEEK 12 each 1  . FLUARIX QUADRIVALENT 0.5 ML injection TO BE ADMINISTERED BY PHARMACIST FOR IMMUNIZATION  0  . folic acid (FOLVITE) 1 MG tablet TAKE 2 TABLETS EVERY DAY 180 tablet 1  . methotrexate 50 MG/2ML injection INJECT 0.8 MLS (20 MG TOTAL) INTO THE SKIN ONCE A WEEK. 10 mL 0  . Omega-3 Fatty Acids (FISH OIL PO) Take 1,200 mg by mouth daily.    Marland Kitchen sulfaSALAzine (AZULFIDINE) 500 MG tablet TAKE 2 TABLETS (1,000 MG TOTAL) BY MOUTH 2 (TWO) TIMES DAILY. 360 tablet 0  . tamoxifen (NOLVADEX) 20 MG tablet Take 1 tablet (20 mg total) by mouth daily. 90 tablet 4  . TUBERCULIN SYR 1CC/27GX1/2" (B-D TB SYRINGE 1CC/27GX1/2") 27G X 1/2" 1 ML MISC Patient to use to inject Methotrexate weekly 12 each 0  . Tuberculin-Allergy Syringes 27G X 1/2" 1 ML KIT Patient to use to Inject Methotrexate weekly 12 each 0  . Tuberculin-Allergy Syringes 27G X 1/2" 1 ML KIT Patient to use to inject to Methotrexate weekly. 12 each 0   No current facility-administered medications for this visit.     OBJECTIVE:  Middle-aged white womanIn no acute distress  Vitals:   08/13/17 1046  BP: (!)  132/52  Pulse: 74  Resp: 18  Temp: 98.3 F (36.8 C)  SpO2: 100%     Body mass index is 34.02 kg/m.    ECOG FS:1 - Symptomatic but completely ambulatory  Sclerae unicteric, pupils round and equal Oropharynx clear and moist No cervical or supraclavicular adenopathy Lungs no rales or rhonchi Heart regular rate and rhythm Abd soft, nontender, positive bowel sounds MSK no focal spinal tenderness, no upper extremity lymphedema Neuro: nonfocal, well oriented, appropriate affect Breasts: The right breast is benign. The left breast is slightly firmer and smaller than the right. The skin is slightly thickened. These are changes expected from her prior treatment. There is no evidence of local recurrence. Both axillae are benign.  LAB RESULTS:  CMP     Component Value Date/Time   NA 140 05/10/2017 1108   NA 139 08/16/2015 1620   K 4.7 05/10/2017 1108   K  3.6 08/16/2015 1620   CL 106 05/10/2017 1108   CO2 23 05/10/2017 1108   CO2 23 08/16/2015 1620   GLUCOSE 85 05/10/2017 1108   GLUCOSE 104 08/16/2015 1620   BUN 11 05/10/2017 1108   BUN 11.2 08/16/2015 1620   CREATININE 0.67 05/10/2017 1108   CREATININE 0.8 08/16/2015 1620   CALCIUM 9.4 05/10/2017 1108   CALCIUM 9.5 08/16/2015 1620   PROT 7.1 05/10/2017 1108   PROT 7.5 08/16/2015 1620   ALBUMIN 4.2 05/10/2017 1108   ALBUMIN 4.2 08/16/2015 1620   AST 19 05/10/2017 1108   AST 18 08/16/2015 1620   ALT 24 05/10/2017 1108   ALT 25 08/16/2015 1620   ALKPHOS 56 05/10/2017 1108   ALKPHOS 53 08/16/2015 1620   BILITOT 0.3 05/10/2017 1108   BILITOT 0.30 08/16/2015 1620   GFRNONAA >89 05/10/2017 1108   GFRAA >89 05/10/2017 1108    INo results found for: SPEP, UPEP  Lab Results  Component Value Date   WBC 8.1 05/10/2017   NEUTROABS 5,346 05/10/2017   HGB 12.6 05/10/2017   HCT 38.6 05/10/2017   MCV 101.0 (H) 05/10/2017   PLT 301 05/10/2017      Chemistry      Component Value Date/Time   NA 140 05/10/2017 1108   NA 139  08/16/2015 1620   K 4.7 05/10/2017 1108   K 3.6 08/16/2015 1620   CL 106 05/10/2017 1108   CO2 23 05/10/2017 1108   CO2 23 08/16/2015 1620   BUN 11 05/10/2017 1108   BUN 11.2 08/16/2015 1620   CREATININE 0.67 05/10/2017 1108   CREATININE 0.8 08/16/2015 1620      Component Value Date/Time   CALCIUM 9.4 05/10/2017 1108   CALCIUM 9.5 08/16/2015 1620   ALKPHOS 56 05/10/2017 1108   ALKPHOS 53 08/16/2015 1620   AST 19 05/10/2017 1108   AST 18 08/16/2015 1620   ALT 24 05/10/2017 1108   ALT 25 08/16/2015 1620   BILITOT 0.3 05/10/2017 1108   BILITOT 0.30 08/16/2015 1620       No results found for: LABCA2  No components found for: LABCA125  No results for input(s): INR in the last 168 hours.  Urinalysis No results found for: COLORURINE, APPEARANCEUR, LABSPEC, [place], GLUCOSEU, HGBUR, BILIRUBINUR, [place], PROTEINUR, UROBILINOGEN, NITRITE, LEUKOCYTESUR  STUDIES: Mm Diag Breast Tomo Bilateral  Result Date: 07/15/2017 CLINICAL DATA:  Status post left lumpectomy for breast carcinoma performed in 2016 with adjuvant radiation therapy.No current complaints. EXAM: 2D DIGITAL DIAGNOSTIC BILATERAL MAMMOGRAM WITH CAD AND ADJUNCT TOMO COMPARISON:  Previous exam(s). ACR Breast Density Category c: The breast tissue is heterogeneously dense, which may obscure small masses. FINDINGS: There are no discrete masses. There is architectural distortion reflecting postsurgical scarring in the posterior upper outer left breast. No other architectural distortion. There are no suspicious calcifications. Mammographic images were processed with CAD. IMPRESSION: 1. No evidence of new or recurrent breast carcinoma. 2. Benign postsurgical changes on the left. RECOMMENDATION: Diagnostic mammography in 1 year per standard post lumpectomy protocol. I have discussed the findings and recommendations with the patient. Results were also provided in writing at the conclusion of the visit. If applicable, a reminder letter  will be sent to the patient regarding the next appointment. BI-RADS CATEGORY  2: Benign. Electronically Signed   By: Lajean Manes M.D.   On: 07/15/2017 13:41    ASSESSMENT: 51 y.o. [place] woman status post left breast upper outer quadrant biopsy 08/05/2015 for ductal carcinoma in situ, high-grade, estrogen  and progesterone receptor positive   (1) genetics testing 09/05/2015 through the Breast/Ovarian Cancer Panel offered by [facility] found no deleterious mutations or variants of uncertain significance in ATM, BARD1, BRCA1, BRCA2, BRIP1, CDH1, CHEK2, FANCC, MLH1, MSH2, MSH6, NBN, PALB2, PMS2, PTEN, RAD51C, RAD51D, TP53, and XRCC2.  This panel also includes deletion/duplication analysis (without sequencing) for one gene, EPCAM.    (2) left lumpectomy 09/02/2015 showed residual high-grade ductal carcinoma in situ, with negative and ample margins  (3) adjuvant radiation 10/18/15-11/11/15 ;  Left breast/ 42.5 Gy at 2.5 Gy per fraction x 17 fractions.  Left breast boost/ 7.5 Gy at 2.5 Gy per fraction x 3 fractions   (4) tamoxifen to started January 2017    PLAN: Katera is now 2 years out from definitive surgery for her noninvasive breast cancer. There is no evidence of disease activity. This is very favorable.  She is tolerating the tamoxifen well. It could be that it is affecting her periods in terms of irregularity, but more likely she is simply going through. Menopause at this point.  We reviewed her mammogram which is favorable.  She will see me again in one year. The plan is to continue tamoxifen for 5 years which will take [place] to December 2021.  She knows to call for any problems that may develop before then.  Chauncey Cruel, MD   08/13/2017 10:56 AM Medical Oncology and Hematology [facility] [address] [place], [place] [address] Tel. [phone]    Fax. [phone] this is Dr. Jana Hakim Menstrual I I don't mind 1.4 that's fine. They were going to continue place  thank you for by

## 2017-08-30 ENCOUNTER — Other Ambulatory Visit: Payer: Self-pay | Admitting: Oncology

## 2017-08-30 ENCOUNTER — Other Ambulatory Visit: Payer: Self-pay | Admitting: *Deleted

## 2017-08-30 DIAGNOSIS — C50412 Malignant neoplasm of upper-outer quadrant of left female breast: Secondary | ICD-10-CM

## 2017-09-16 ENCOUNTER — Other Ambulatory Visit: Payer: Self-pay

## 2017-09-16 DIAGNOSIS — Z79899 Other long term (current) drug therapy: Secondary | ICD-10-CM

## 2017-09-17 LAB — CBC WITH DIFFERENTIAL/PLATELET
BASOS ABS: 78 {cells}/uL (ref 0–200)
Basophils Relative: 1.1 %
Eosinophils Absolute: 334 cells/uL (ref 15–500)
Eosinophils Relative: 4.7 %
HEMATOCRIT: 32.7 % — AB (ref 35.0–45.0)
Hemoglobin: 11.1 g/dL — ABNORMAL LOW (ref 11.7–15.5)
LYMPHS ABS: 1470 {cells}/uL (ref 850–3900)
MCH: 33.1 pg — ABNORMAL HIGH (ref 27.0–33.0)
MCHC: 33.9 g/dL (ref 32.0–36.0)
MCV: 97.6 fL (ref 80.0–100.0)
MPV: 11.6 fL (ref 7.5–12.5)
Monocytes Relative: 6.1 %
NEUTROS PCT: 67.4 %
Neutro Abs: 4785 cells/uL (ref 1500–7800)
Platelets: 307 10*3/uL (ref 140–400)
RBC: 3.35 10*6/uL — ABNORMAL LOW (ref 3.80–5.10)
RDW: 12.8 % (ref 11.0–15.0)
Total Lymphocyte: 20.7 %
WBC: 7.1 10*3/uL (ref 3.8–10.8)
WBCMIX: 433 {cells}/uL (ref 200–950)

## 2017-09-17 LAB — COMPLETE METABOLIC PANEL WITH GFR
AG Ratio: 1.4 (calc) (ref 1.0–2.5)
ALKALINE PHOSPHATASE (APISO): 47 U/L (ref 33–130)
ALT: 14 U/L (ref 6–29)
AST: 15 U/L (ref 10–35)
Albumin: 4 g/dL (ref 3.6–5.1)
BUN: 16 mg/dL (ref 7–25)
CHLORIDE: 107 mmol/L (ref 98–110)
CO2: 24 mmol/L (ref 20–32)
CREATININE: 0.69 mg/dL (ref 0.50–1.05)
Calcium: 9.2 mg/dL (ref 8.6–10.4)
GFR, Est African American: 118 mL/min/{1.73_m2} (ref >=60)
GFR, Est Non African American: 102 mL/min/{1.73_m2} (ref >=60)
GLOBULIN: 2.9 g/dL (ref 1.9–3.7)
Glucose, Bld: 115 mg/dL — ABNORMAL HIGH (ref 65–99)
POTASSIUM: 4.3 mmol/L (ref 3.5–5.3)
SODIUM: 140 mmol/L (ref 135–146)
Total Bilirubin: 0.3 mg/dL (ref 0.2–1.2)
Total Protein: 6.9 g/dL (ref 6.1–8.1)

## 2017-09-18 ENCOUNTER — Other Ambulatory Visit: Payer: Self-pay | Admitting: Rheumatology

## 2017-09-18 MED ORDER — "TUBERCULIN-ALLERGY SYRINGES 28G X 1/2"" 1 ML MISC": 1 refills | Status: DC

## 2017-09-18 NOTE — Telephone Encounter (Signed)
Last Visit: 05/10/17 Next Visit: 10/10/17 Labs: 09/16/17 anemia  Okay to refill per Dr. Estanislado Pandy

## 2017-09-18 NOTE — Telephone Encounter (Signed)
Patient called to request refill of MTX and syringes to be sent to [facility] in [place].

## 2017-09-29 NOTE — Progress Notes (Deleted)
Office Visit Note  Patient: Kim Porter             Date of Birth: 08-11-1966           MRN: [id number]             PCP: Jefm Petty, MD Referring: Jefm Petty, MD Visit Date: 10/10/2017 Occupation: @GUAROCC @    Subjective:  No chief complaint on file.   History of Present Illness: Kim Porter is a 51 y.o. female ***   Activities of Daily Living:  Patient reports morning stiffness for *** {minute/hour:19697}.   Patient {ACTIONS;DENIES/REPORTS:21021675::"Denies"} nocturnal pain.  Difficulty dressing/grooming: {ACTIONS;DENIES/REPORTS:21021675::"Denies"} Difficulty climbing stairs: {ACTIONS;DENIES/REPORTS:21021675::"Denies"} Difficulty getting out of chair: {ACTIONS;DENIES/REPORTS:21021675::"Denies"} Difficulty using hands for taps, buttons, cutlery, and/or writing: {ACTIONS;DENIES/REPORTS:21021675::"Denies"}   No Rheumatology ROS completed.   PMFS History:  Patient Active Problem List   Diagnosis Date Noted  . Effusion, left knee 11/23/2016  . High risk medication use 11/23/2016  . Trigger ring finger of right hand 11/23/2016  . History of right Achilles tendinitis 11/23/2016  . Primary osteoarthritis of both knees 11/23/2016  . Primary osteoarthritis of both hands 11/23/2016  . Genetic testing 09/06/2015  . Family history of breast cancer in mother 08/25/2015  . Family history of breast cancer in female 08/25/2015  . Rheumatoid arthritis ([place]) 08/16/2015  . Breast cancer of upper-outer quadrant of left female breast ([place]) 08/12/2015    Past Medical History:  Diagnosis Date  . Breast cancer of upper-outer quadrant of left female breast ([place]) 08/12/2015  . Personal history of radiation therapy 2016  . Radiation 10/18/15-11/11/15   left breast 42.5 Gy, left breast boost 7.5 Gy  . Rheumatoid arthritis ([place])     Family History  Problem Relation Age of Onset  . Breast cancer Mother 82  . Colon polyps Mother 1  . Other Sister        has had "7 lumpectomies, all  benign"  . Diabetes Sister   . Cancer Maternal Grandmother        unknown primary - metastasized  . Emphysema Maternal Grandfather   . Cancer Maternal Uncle 61       unspecified type; "caused by agent orange"  . Breast cancer Other        (x2); dx. 80 and 75  . Breast cancer Cousin 66       multiple primaries; negative genetic testing   . Alzheimer's disease Other    Past Surgical History:  Procedure Laterality Date  . BREAST LUMPECTOMY Left 2016  . BREAST LUMPECTOMY WITH RADIOACTIVE SEED LOCALIZATION Left 09/02/2015   Procedure: LEFT BREAST LUMPECTOMY WITH RADIOACTIVE SEED LOCALIZATION;  Surgeon: Fanny Skates, MD;  Location: [facility];  Service: General;  Laterality: Left;  . EYE SURGERY    . Lasix surgery bilateral      Social History   Social History Narrative  . No narrative on file     Objective: Vital Signs: There were no vitals taken for this visit.   Physical Exam   Musculoskeletal Exam: ***  CDAI Exam: No CDAI exam completed.    Investigation: No additional findings. CBC Latest Ref Rng & Units 09/16/2017 05/10/2017 03/05/2017  WBC 3.8 - 10.8 Thousand/uL 7.1 8.1 6.2  Hemoglobin 11.7 - 15.5 g/dL 11.1(L) 12.6 12.0  Hematocrit 35.0 - 45.0 % 32.7(L) 38.6 37.6  Platelets 140 - 400 Thousand/uL 307 301 299   CMP Latest Ref Rng & Units 09/16/2017 05/10/2017 03/05/2017  Glucose 65 - 99 mg/dL 115(H) 85  128(H)  BUN 7 - 25 mg/dL 16 11 10   Creatinine 0.50 - 1.05 mg/dL 0.69 0.67 0.75  Sodium 135 - 146 mmol/L 140 140 139  Potassium 3.5 - 5.3 mmol/L 4.3 4.7 4.6  Chloride 98 - 110 mmol/L 107 106 108  CO2 20 - 32 mmol/L 24 23 21   Calcium 8.6 - 10.4 mg/dL 9.2 9.4 9.3  Total Protein 6.1 - 8.1 g/dL 6.9 7.1 6.9  Total Bilirubin 0.2 - 1.2 mg/dL 0.3 0.3 0.4  Alkaline Phos 33 - 130 U/L - 56 50  AST 10 - 35 U/L 15 19 19   ALT 6 - 29 U/L 14 24 17     Imaging: No results found.  Speciality Comments: No specialty comments available.    Procedures:  No  procedures performed Allergies: Patient has no known allergies.   Assessment / Plan:     Visit Diagnoses: No diagnosis found.    Orders: No orders of the defined types were placed in this encounter.  No orders of the defined types were placed in this encounter.   Face-to-face time spent with patient was *** minutes. 50% of time was spent in counseling and coordination of care.  Follow-Up Instructions: No Follow-up on file.   Earnestine Mealing, NT  Note - This record has been created using Editor, commissioning.  Chart creation errors have been sought, but may not always  have been located. Such creation errors do not reflect on  the standard of medical care.

## 2017-10-09 ENCOUNTER — Telehealth: Payer: Self-pay

## 2017-10-09 MED ORDER — "TUBERCULIN-ALLERGY SYRINGES 28G X 1/2"" 1 ML MISC": 1 refills | Status: DC

## 2017-10-09 NOTE — Addendum Note (Signed)
Addended by: Carole Binning on: 10/09/2017 11:28 AM   Modules accepted: Orders

## 2017-10-09 NOTE — Telephone Encounter (Signed)
Last Visit: 05/10/17 Next Visit: 10/10/17  Okay to refill per Dr. Estanislado Pandy  Patient advised.

## 2017-10-09 NOTE — Telephone Encounter (Signed)
Patient would like a refill on Tuberculin-Allergy Syringes.  Cb# is [phone].  Please advise.  Thank You.

## 2017-10-10 ENCOUNTER — Ambulatory Visit: Payer: BLUE CROSS/BLUE SHIELD | Admitting: Rheumatology

## 2017-11-26 ENCOUNTER — Other Ambulatory Visit: Payer: Self-pay | Admitting: Rheumatology

## 2017-11-26 NOTE — Telephone Encounter (Signed)
Last Visit: 05/10/17 Next Visit: 03/04/18 Labs: 09/16/17 anemia  Okay to refill per Dr. Estanislado Pandy

## 2018-01-07 ENCOUNTER — Other Ambulatory Visit: Payer: Self-pay

## 2018-01-07 ENCOUNTER — Other Ambulatory Visit: Payer: Self-pay | Admitting: *Deleted

## 2018-01-07 DIAGNOSIS — Z79899 Other long term (current) drug therapy: Secondary | ICD-10-CM

## 2018-01-07 LAB — COMPLETE METABOLIC PANEL WITH GFR
AG Ratio: 1.5 (calc) (ref 1.0–2.5)
ALBUMIN MSPROF: 4.5 g/dL (ref 3.6–5.1)
ALT: 22 U/L (ref 6–29)
AST: 20 U/L (ref 10–35)
Alkaline phosphatase (APISO): 48 U/L (ref 33–130)
BUN: 12 mg/dL (ref 7–25)
CALCIUM: 9.7 mg/dL (ref 8.6–10.4)
CO2: 23 mmol/L (ref 20–32)
Chloride: 106 mmol/L (ref 98–110)
Creat: 0.74 mg/dL (ref 0.50–1.05)
GFR, EST NON AFRICAN AMERICAN: 94 mL/min/{1.73_m2} (ref >=60)
GFR, Est African American: 109 mL/min/{1.73_m2} (ref >=60)
GLOBULIN: 3 g/dL (ref 1.9–3.7)
GLUCOSE: 90 mg/dL (ref 65–99)
Potassium: 4.5 mmol/L (ref 3.5–5.3)
SODIUM: 140 mmol/L (ref 135–146)
TOTAL PROTEIN: 7.5 g/dL (ref 6.1–8.1)
Total Bilirubin: 0.5 mg/dL (ref 0.2–1.2)

## 2018-01-07 LAB — CBC WITH DIFFERENTIAL/PLATELET
BASOS PCT: 1.3 %
Basophils Absolute: 112 cells/uL (ref 0–200)
EOS ABS: 292 {cells}/uL (ref 15–500)
Eosinophils Relative: 3.4 %
HEMATOCRIT: 37.8 % (ref 35.0–45.0)
Hemoglobin: 12.8 g/dL (ref 11.7–15.5)
LYMPHS ABS: 2004 {cells}/uL (ref 850–3900)
MCH: 32.2 pg (ref 27.0–33.0)
MCHC: 33.9 g/dL (ref 32.0–36.0)
MCV: 95 fL (ref 80.0–100.0)
MPV: 12.5 fL (ref 7.5–12.5)
Monocytes Relative: 5.9 %
NEUTROS PCT: 66.1 %
Neutro Abs: 5685 cells/uL (ref 1500–7800)
Platelets: 286 10*3/uL (ref 140–400)
RBC: 3.98 10*6/uL (ref 3.80–5.10)
RDW: 12.7 % (ref 11.0–15.0)
TOTAL LYMPHOCYTE: 23.3 %
WBC: 8.6 10*3/uL (ref 3.8–10.8)
WBCMIX: 507 {cells}/uL (ref 200–950)

## 2018-01-09 ENCOUNTER — Other Ambulatory Visit: Payer: Self-pay | Admitting: Rheumatology

## 2018-01-09 MED ORDER — FOLIC ACID 1 MG PO TABS: 2.0000 mg | ORAL_TABLET | Freq: Every day | ORAL | 3 refills | Status: DC

## 2018-01-09 NOTE — Addendum Note (Signed)
Addended by: Carole Binning on: 01/09/2018 03:21 PM   Modules accepted: Orders

## 2018-01-09 NOTE — Telephone Encounter (Signed)
Last Visit: 05/10/17 Next Visit: 03/04/18 Labs: 01/07/18 WNL  Okay to refill per Dr. Estanislado Pandy

## 2018-02-19 NOTE — Progress Notes (Deleted)
Office Visit Note  Patient: Kim Porter             Date of Birth: Oct 11, 1966           MRN: [id number]             PCP: Jefm Petty, MD Referring: Jefm Petty, MD Visit Date: 03/04/2018 Occupation: @GUAROCC @    Subjective:  No chief complaint on file.   History of Present Illness: Kim Porter is a 52 y.o. female ***   Activities of Daily Living:  Patient reports morning stiffness for *** {minute/hour:19697}.   Patient {ACTIONS;DENIES/REPORTS:21021675::"Denies"} nocturnal pain.  Difficulty dressing/grooming: {ACTIONS;DENIES/REPORTS:21021675::"Denies"} Difficulty climbing stairs: {ACTIONS;DENIES/REPORTS:21021675::"Denies"} Difficulty getting out of chair: {ACTIONS;DENIES/REPORTS:21021675::"Denies"} Difficulty using hands for taps, buttons, cutlery, and/or writing: {ACTIONS;DENIES/REPORTS:21021675::"Denies"}   No Rheumatology ROS completed.   PMFS History:  Patient Active Problem List   Diagnosis Date Noted  . Effusion, left knee 11/23/2016  . High risk medication use 11/23/2016  . Trigger ring finger of right hand 11/23/2016  . History of right Achilles tendinitis 11/23/2016  . Primary osteoarthritis of both knees 11/23/2016  . Primary osteoarthritis of both hands 11/23/2016  . Genetic testing 09/06/2015  . Family history of breast cancer in mother 08/25/2015  . Family history of breast cancer in female 08/25/2015  . Rheumatoid arthritis ([place]) 08/16/2015  . Breast cancer of upper-outer quadrant of left female breast ([place]) 08/12/2015    Past Medical History:  Diagnosis Date  . Breast cancer of upper-outer quadrant of left female breast ([place]) 08/12/2015  . Personal history of radiation therapy 2016  . Radiation 10/18/15-11/11/15   left breast 42.5 Gy, left breast boost 7.5 Gy  . Rheumatoid arthritis ([place])     Family History  Problem Relation Age of Onset  . Breast cancer Mother 33  . Colon polyps Mother 67  . Other Sister        has had "7 lumpectomies, all  benign"  . Diabetes Sister   . Cancer Maternal Grandmother        unknown primary - metastasized  . Emphysema Maternal Grandfather   . Cancer Maternal Uncle 61       unspecified type; "caused by agent orange"  . Breast cancer Other        (x2); dx. 70 and 75  . Breast cancer Cousin 66       multiple primaries; negative genetic testing   . Alzheimer's disease Other    Past Surgical History:  Procedure Laterality Date  . BREAST LUMPECTOMY Left 2016  . BREAST LUMPECTOMY WITH RADIOACTIVE SEED LOCALIZATION Left 09/02/2015   Procedure: LEFT BREAST LUMPECTOMY WITH RADIOACTIVE SEED LOCALIZATION;  Surgeon: Fanny Skates, MD;  Location: [facility];  Service: General;  Laterality: Left;  . EYE SURGERY    . Lasix surgery bilateral      Social History   Social History Narrative  . Not on file     Objective: Vital Signs: There were no vitals taken for this visit.   Physical Exam   Musculoskeletal Exam: ***  CDAI Exam: No CDAI exam completed.    Investigation: No additional findings. CBC Latest Ref Rng & Units 01/07/2018 09/16/2017 05/10/2017  WBC 3.8 - 10.8 Thousand/uL 8.6 7.1 8.1  Hemoglobin 11.7 - 15.5 g/dL 12.8 11.1(L) 12.6  Hematocrit 35.0 - 45.0 % 37.8 32.7(L) 38.6  Platelets 140 - 400 Thousand/uL 286 307 301   CMP Latest Ref Rng & Units 01/07/2018 09/16/2017 05/10/2017  Glucose 65 - 99 mg/dL 90 115(H) 85  BUN 7 - 25 mg/dL 12 16 11   Creatinine 0.50 - 1.05 mg/dL 0.74 0.69 0.67  Sodium 135 - 146 mmol/L 140 140 140  Potassium 3.5 - 5.3 mmol/L 4.5 4.3 4.7  Chloride 98 - 110 mmol/L 106 107 106  CO2 20 - 32 mmol/L 23 24 23   Calcium 8.6 - 10.4 mg/dL 9.7 9.2 9.4  Total Protein 6.1 - 8.1 g/dL 7.5 6.9 7.1  Total Bilirubin 0.2 - 1.2 mg/dL 0.5 0.3 0.3  Alkaline Phos 33 - 130 U/L - - 56  AST 10 - 35 U/L 20 15 19   ALT 6 - 29 U/L 22 14 24    Imaging: No results found.  Speciality Comments: No specialty comments available.    Procedures:  No procedures  performed Allergies: Patient has no known allergies.   Assessment / Plan:     Visit Diagnoses: No diagnosis found.    Orders: No orders of the defined types were placed in this encounter.  No orders of the defined types were placed in this encounter.   Face-to-face time spent with patient was *** minutes. 50% of time was spent in counseling and coordination of care.  Follow-Up Instructions: No Follow-up on file.   Earnestine Mealing, CMA  Note - This record has been created using Editor, commissioning.  Chart creation errors have been sought, but may not always  have been located. Such creation errors do not reflect on  the standard of medical care.

## 2018-02-22 ENCOUNTER — Other Ambulatory Visit: Payer: Self-pay | Admitting: Rheumatology

## 2018-02-24 NOTE — Telephone Encounter (Signed)
Last Visit: 05/10/17 Next Visit:03/04/18 Labs: 01/07/18 WNL  Okay to refill per Dr. Estanislado Pandy

## 2018-02-26 ENCOUNTER — Other Ambulatory Visit: Payer: Self-pay | Admitting: Oncology

## 2018-02-26 DIAGNOSIS — C50412 Malignant neoplasm of upper-outer quadrant of left female breast: Secondary | ICD-10-CM

## 2018-03-04 ENCOUNTER — Ambulatory Visit: Payer: BLUE CROSS/BLUE SHIELD | Admitting: Rheumatology

## 2018-03-29 ENCOUNTER — Other Ambulatory Visit: Payer: Self-pay | Admitting: Rheumatology

## 2018-04-10 ENCOUNTER — Telehealth: Payer: Self-pay | Admitting: *Deleted

## 2018-04-10 ENCOUNTER — Other Ambulatory Visit: Payer: Self-pay | Admitting: *Deleted

## 2018-04-10 NOTE — Telephone Encounter (Signed)
This RN spoke to pt per her return call.  She states her mother was diagnosed with uterine cancer post being on tamoxifen for history of breast cancer.  Kim Porter is seen for gyn/PAP with recent visit.  She is not having abnormal vaginal bleeding with current status as " perimenopause ".  This RN discussed tamoxifen and associated uterine cancer risk as well as this drug therapy is most appropriate for her due to her menopausal status.  This note will be given to MD for review for any further recommendations.  Kim Porter is currently scheduled for visit in September 2019.

## 2018-04-10 NOTE — Telephone Encounter (Signed)
This RN returned call to pt per her message stating concern due to her mother recently being diagnosed with uterine cancer and concern for herself due to being on tamoxifen.  Obtained VM - message left to return call.  Of note pt is not BRCA 1 or 2 positive per genetic testing.

## 2018-04-29 ENCOUNTER — Other Ambulatory Visit: Payer: Self-pay

## 2018-04-29 DIAGNOSIS — Z79899 Other long term (current) drug therapy: Secondary | ICD-10-CM

## 2018-04-30 ENCOUNTER — Other Ambulatory Visit: Payer: Self-pay | Admitting: Rheumatology

## 2018-04-30 LAB — COMPLETE METABOLIC PANEL WITH GFR
AG Ratio: 1.4 (calc) (ref 1.0–2.5)
ALT: 22 U/L (ref 6–29)
AST: 22 U/L (ref 10–35)
Albumin: 4.2 g/dL (ref 3.6–5.1)
Alkaline phosphatase (APISO): 48 U/L (ref 33–130)
BUN: 17 mg/dL (ref 7–25)
CALCIUM: 9.5 mg/dL (ref 8.6–10.4)
CO2: 27 mmol/L (ref 20–32)
CREATININE: 0.78 mg/dL (ref 0.50–1.05)
Chloride: 105 mmol/L (ref 98–110)
GFR, Est African American: 102 mL/min/{1.73_m2} (ref >=60)
GFR, Est Non African American: 88 mL/min/{1.73_m2} (ref >=60)
GLOBULIN: 2.9 g/dL (ref 1.9–3.7)
GLUCOSE: 120 mg/dL — AB (ref 65–99)
POTASSIUM: 4.8 mmol/L (ref 3.5–5.3)
SODIUM: 139 mmol/L (ref 135–146)
Total Bilirubin: 0.3 mg/dL (ref 0.2–1.2)
Total Protein: 7.1 g/dL (ref 6.1–8.1)

## 2018-04-30 LAB — CBC WITH DIFFERENTIAL/PLATELET
BASOS ABS: 71 {cells}/uL (ref 0–200)
BASOS PCT: 1.2 %
EOS PCT: 4 %
Eosinophils Absolute: 236 cells/uL (ref 15–500)
HEMATOCRIT: 37 % (ref 35.0–45.0)
Hemoglobin: 12.7 g/dL (ref 11.7–15.5)
LYMPHS ABS: 1711 {cells}/uL (ref 850–3900)
MCH: 33.1 pg — ABNORMAL HIGH (ref 27.0–33.0)
MCHC: 34.3 g/dL (ref 32.0–36.0)
MCV: 96.4 fL (ref 80.0–100.0)
MONOS PCT: 6.1 %
MPV: 12.1 fL (ref 7.5–12.5)
Neutro Abs: 3522 cells/uL (ref 1500–7800)
Neutrophils Relative %: 59.7 %
Platelets: 256 10*3/uL (ref 140–400)
RBC: 3.84 10*6/uL (ref 3.80–5.10)
RDW: 12.8 % (ref 11.0–15.0)
Total Lymphocyte: 29 %
WBC mixed population: 360 cells/uL (ref 200–950)
WBC: 5.9 10*3/uL (ref 3.8–10.8)

## 2018-05-01 ENCOUNTER — Other Ambulatory Visit: Payer: Self-pay | Admitting: Oncology

## 2018-05-01 NOTE — Telephone Encounter (Signed)
Last visit: 05/10/2017 Next visit: 07/15/2018 Labs: 04/29/2018 elevated glucose, all other labs WNL   Okay to refill per Dr. Estanislado Pandy.

## 2018-05-01 NOTE — Progress Notes (Signed)
Kim Porter called to tell us that her mother developed endometrial cancer after being on tamoxifen for some time.  I left her message since I was not able to reach her.  She may stop tamoxifen now she wishes and then see me in about 2 months to discuss switching to an aromatase inhibitor.

## 2018-05-02 ENCOUNTER — Telehealth: Payer: Self-pay

## 2018-05-02 NOTE — Telephone Encounter (Signed)
Printed a calender of upcoming appointment. Per 5/24 phone que and mailed to patient with letter enclosed

## 2018-06-16 ENCOUNTER — Telehealth: Payer: Self-pay | Admitting: Oncology

## 2018-06-16 NOTE — Telephone Encounter (Signed)
Patient said she didn't need this appointment

## 2018-06-20 ENCOUNTER — Other Ambulatory Visit: Payer: Self-pay | Admitting: Oncology

## 2018-06-20 DIAGNOSIS — Z853 Personal history of malignant neoplasm of breast: Secondary | ICD-10-CM

## 2018-06-30 NOTE — Progress Notes (Signed)
[facility]  [phone] Fax:([phone]     ID: Kim Porter DOB: 10-Jun-1966  MR#: [id number]  DIY#:[id number]  Patient Care Team: Jefm Petty, MD as PCP - General (Family Medicine) Vania Rea, MD as Consulting Physician (Obstetrics and Gynecology) Chanetta Moosman, Virgie Dad, MD as Consulting Physician (Oncology) Fanny Skates, MD as Consulting Physician (General Surgery) Sylvan Cheese, NP as Nurse Practitioner (Hematology and Oncology) Thea Silversmith, MD as Consulting Physician (Radiation Oncology) PCP: Jefm Petty, MD OTHER MD: Dyke Brackett MD, Nena Polio MD, Thea Silversmith M.D.  CHIEF COMPLAINT: ductal carcinoma in situ  CURRENT TREATMENT: Observation   BREAST CANCER HISTORY: From the original intake note:  Kim Porter had bilateral screening mammography with tomography 07/11/2015 showing calcifications in the left breast, requiring further imaging.on 07/15/2015 she underwent a diagnostic left mammography which showed the breast density to be category C. 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  Her subsequent history is as detailed below.  INTERVAL HISTORY: Kim Porter returns today for her estrogen receptor positive ductal carcinoma in situ. The patient stopped taking tamoxifen May 2019 due to her mother developing endometrial cancer while taking tamoxifen. The patient is concerned that this could also happen to her.   About 2 weeks after stopping, she had a heavy period that lasted about 2 weeks. She has not had a period since then. She notes that her last period before that was in February 2019. She was having regular periods before starting tamoxifen. She had a D&C due to past  heavy bleeding. Her periods became irregular.   She is also having morefrequent hot flashes since stopping, which seems unusual. She denies issues with increased vaginal discharge.    REVIEW OF SYSTEMS: Kim Porter reports that her mother is doing well. They went to dinner together. She continues to ride horses and exercise. She has swelling and arthritis in her knees that she notices when she sits for a while. If she has a RA flair up she occasionally has swelling in her hands, but she denies any additional swelling. She denies unusual headaches, visual changes, nausea, vomiting, or dizziness. There has been no unusual cough, phlegm production, or pleurisy. This been no change in bowel or bladder habits. She denies unexplained fatigue or unexplained weight loss, bleeding, rash, or fever. A detailed review of systems was otherwise stable.    PAST MEDICAL HISTORY: Past Medical History:  Diagnosis Date  . Breast cancer of upper-outer quadrant of left female breast ([facility]) 08/12/2015  . Personal history of radiation therapy 2016  . Radiation 10/18/15-11/11/15   left breast 42.5 Gy, left breast boost 7.5 Gy  . Rheumatoid arthritis ([place])     PAST SURGICAL HISTORY: Past Surgical History:  Procedure Laterality Date  . BREAST LUMPECTOMY Left 2016  . BREAST LUMPECTOMY WITH RADIOACTIVE SEED LOCALIZATION Left 09/02/2015   Procedure: LEFT BREAST LUMPECTOMY WITH RADIOACTIVE SEED LOCALIZATION;  Surgeon: Fanny Skates, MD;  Location: [facility];  Service: General;  Laterality: Left;  . EYE SURGERY    . Lasix surgery bilateral       FAMILY HISTORY Family History  Problem Relation Age of Onset  . Breast cancer Mother 80  . Colon polyps Mother 17  . Other Sister        has had "7 lumpectomies,  all benign"  . Diabetes Sister   . Cancer Maternal Grandmother        unknown primary - metastasized  . Emphysema Maternal Grandfather   . Cancer Maternal Uncle 61       unspecified type; "caused by  agent orange"  . Breast cancer Other        (x2); dx. 14 and 75  . Breast cancer Cousin 66       multiple primaries; negative genetic testing   . Alzheimer's disease Other     the patient's parents are still living, in their late 87s. The patient's mother was diagnosed with breast cancer the age of 59.  She subsequently developed uterine cancer.  The maternal grandmother may have had breast cancer diagnosed at age 22. 54 of the patient's mother's maternal aunts were diagnosed with breast cancer at the ages of 50 and 6. The patient also has a second cousin diagnosed with breast cancer at age 59. This cousin has been tested for a mutation  ( extensive panel) and no mutation was found  GYNECOLOGIC HISTORY:  No LMP recorded. (Menstrual status: Irregular Periods).  Menarche age 46 , the patient is GX P0. She is still having regular periods. 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 Her husband, Liliane Channel, is a Customer service manager. They have land and keep approximately 75 horses, many of them theirs or otherwise boarded. They also have 3 dogs and one cat.    ADVANCED DIRECTIVES:  Not in place   HEALTH MAINTENANCE: Social History   Tobacco Use  . Smoking status: Former Smoker    Types: Cigarettes  . Smokeless tobacco: Never Used  . Tobacco comment: 1 ppwk for 4 yrs in college  Substance Use Topics  . Alcohol use: Yes    Alcohol/week: 0.6 - 1.2 oz    Types: 1 - 2 Cans of beer per week  . Drug use: No     Colonoscopy:  PAP:  Bone density:  Lipid panel:  No Known Allergies  Current Outpatient Medications  Medication Sig Dispense Refill  . FLUARIX QUADRIVALENT 0.5 ML injection TO BE ADMINISTERED BY PHARMACIST FOR IMMUNIZATION  0  . folic acid (FOLVITE) 1 MG tablet Take 2 tablets (2 mg total) by mouth daily. 180 tablet 3  . methotrexate 50 MG/2ML injection INJECT 0.8 MLS (20 MG TOTAL) INTO THE SKIN ONCE A  WEEK. 10 mL 0  . Omega-3 Fatty Acids (FISH OIL PO) Take 1,200 mg by mouth daily.    Kim Porter Kitchen sulfaSALAzine (AZULFIDINE) 500 MG tablet TAKE 2 TABLETS (1,000 MG TOTAL) BY MOUTH 2 (TWO) TIMES DAILY. 360 tablet 0  . tamoxifen (NOLVADEX) 20 MG tablet TAKE 1 TABLET (20 MG TOTAL) BY MOUTH DAILY. 90 tablet 1  . Tuberculin-Allergy Syringes (B-D ALLERGY SYRINGE 1CC/28G) 28G X 1/2" 1 ML MISC USE 1 SYRINGE ONCE A WEEK 12 each 1   No current facility-administered medications for this visit.     OBJECTIVE:  Middle-aged white womanI who appears well  Vitals:   07/01/18 1307  BP: 125/69  Pulse: 68  Resp: 18  Temp: 98 F (36.7 C)  SpO2: 100%     Body mass index is 33.68 kg/m.    ECOG FS:0 - Asymptomatic  Sclerae unicteric, EOMs intact Oropharynx clear and moist No cervical or supraclavicular adenopathy Lungs no rales or rhonchi Heart regular rate and rhythm Abd soft, nontender, positive bowel sounds  MSK no focal spinal tenderness, no upper extremity lymphedema Neuro: nonfocal, well oriented, appropriate affect Breasts: The right breast is unremarkable.  The left breast is status post lumpectomy and radiation.  The cosmetic result is good.  There is some skin induration as expected.  There is no erythema.  Both axillae are benign.  LAB RESULTS:  CMP     Component Value Date/Time   NA 139 04/29/2018 1137   NA 139 08/16/2015 1620   K 4.8 04/29/2018 1137   K 3.6 08/16/2015 1620   CL 105 04/29/2018 1137   CO2 27 04/29/2018 1137   CO2 23 08/16/2015 1620   GLUCOSE 120 (H) 04/29/2018 1137   GLUCOSE 104 08/16/2015 1620   BUN 17 04/29/2018 1137   BUN 11.2 08/16/2015 1620   CREATININE 0.78 04/29/2018 1137   CREATININE 0.8 08/16/2015 1620   CALCIUM 9.5 04/29/2018 1137   CALCIUM 9.5 08/16/2015 1620   PROT 7.1 04/29/2018 1137   PROT 7.5 08/16/2015 1620   ALBUMIN 4.2 05/10/2017 1108   ALBUMIN 4.2 08/16/2015 1620   AST 22 04/29/2018 1137   AST 18 08/16/2015 1620   ALT 22 04/29/2018 1137   ALT 25  08/16/2015 1620   ALKPHOS 56 05/10/2017 1108   ALKPHOS 53 08/16/2015 1620   BILITOT 0.3 04/29/2018 1137   BILITOT 0.30 08/16/2015 1620   GFRNONAA 88 04/29/2018 1137   GFRAA 102 04/29/2018 1137    INo results found for: SPEP, UPEP  Lab Results  Component Value Date   WBC 5.9 04/29/2018   NEUTROABS 3,522 04/29/2018   HGB 12.7 04/29/2018   HCT 37.0 04/29/2018   MCV 96.4 04/29/2018   PLT 256 04/29/2018      Chemistry      Component Value Date/Time   NA 139 04/29/2018 1137   NA 139 08/16/2015 1620   K 4.8 04/29/2018 1137   K 3.6 08/16/2015 1620   CL 105 04/29/2018 1137   CO2 27 04/29/2018 1137   CO2 23 08/16/2015 1620   BUN 17 04/29/2018 1137   BUN 11.2 08/16/2015 1620   CREATININE 0.78 04/29/2018 1137   CREATININE 0.8 08/16/2015 1620      Component Value Date/Time   CALCIUM 9.5 04/29/2018 1137   CALCIUM 9.5 08/16/2015 1620   ALKPHOS 56 05/10/2017 1108   ALKPHOS 53 08/16/2015 1620   AST 22 04/29/2018 1137   AST 18 08/16/2015 1620   ALT 22 04/29/2018 1137   ALT 25 08/16/2015 1620   BILITOT 0.3 04/29/2018 1137   BILITOT 0.30 08/16/2015 1620       No results found for: LABCA2  No components found for: LABCA125  No results for input(s): INR in the last 168 hours.  Urinalysis No results found for: COLORURINE, APPEARANCEUR, LABSPEC, PHURINE, GLUCOSEU, HGBUR, BILIRUBINUR, KETONESUR, PROTEINUR, UROBILINOGEN, NITRITE, LEUKOCYTESUR  STUDIES: No results found.  ASSESSMENT: 52 y.o. [place] woman status post left breast upper outer quadrant biopsy 08/05/2015 for ductal carcinoma in situ, high-grade, estrogen and progesterone receptor positive   (1) genetics testing 09/05/2015 through the Breast/Ovarian Cancer Panel offered by [facility] found no deleterious mutations or variants of uncertain significance in ATM, BARD1, BRCA1, BRCA2, BRIP1, CDH1, CHEK2, FANCC, MLH1, MSH2, MSH6, NBN, PALB2, PMS2, PTEN, RAD51C, RAD51D, TP53, and XRCC2.  This panel also includes  deletion/duplication analysis (without sequencing) for one gene, EPCAM.    (2) left lumpectomy 09/02/2015 showed residual high-grade ductal carcinoma in situ, with negative and ample margins  (3) adjuvant radiation 10/18/15-11/11/15 ;  Left breast/  42.5 Gy at 2.5 Gy per fraction x 17 fractions.  Left breast boost/ 7.5 Gy at 2.5 Gy per fraction x 3 fractions   (4) tamoxifen started January 2017  (a) tamoxifen discontinued May 2019 when patient's mother developed uterine cancer while on tamoxifen    PLAN: Kim Porter is now just about 3 years out from definitive surgery for her noninvasive breast cancer, with no evidence of disease recurrence.  This is very favorable.  She tolerated tamoxifen well but her concern regarding endometrial cancer is not misplaced, given the now family history.  We did review the data which shows one additional endometrial cancer per thousand women per year on tamoxifen.  I have not had any premenopausal patients develop endometrial cancer while on tamoxifen likely because they are constantly shedding the endometrial lining.  Her very heavy.  In May likely accomplished just to that.  Accordingly I do not think she needs endometrial ultrasound but if that makes her feel little bit more secure of course of those are noninvasive and involved no radiation.  She does not qualify for aromatase inhibitors for prevention since she is still premenopausal.  We discussed the fact that her breast cancer was noninvasive, cannot spread to other areas, and therefore is not life-threatening.  Accordingly at this point we are staying off antiestrogens at least until she is clearly postmenopausal at which time she could consider anastrozole if she wished.  She will have mammography in August of this year and then again August 2020, after which she will see me  She knows to call for any other issues that may develop before then.   Kim Porter, Virgie Dad, MD  07/01/18 1:25 PM Medical  Oncology and Hematology [facility] [address] [place], [place] [address] Tel. [phone]    Fax. [phone]  Alice Rieger, am acting as scribe for Chauncey Cruel MD.  I, Lurline Del MD, have reviewed the above documentation for accuracy and completeness, and I agree with the above.

## 2018-07-01 ENCOUNTER — Telehealth: Payer: Self-pay | Admitting: Oncology

## 2018-07-01 ENCOUNTER — Inpatient Hospital Stay: Payer: BLUE CROSS/BLUE SHIELD | Attending: Oncology | Admitting: Oncology

## 2018-07-01 VITALS — BP 125/69 | HR 68 | Temp 98.0°F | Resp 18 | Ht 70.0 in | Wt 234.7 lb

## 2018-07-01 DIAGNOSIS — M069 Rheumatoid arthritis, unspecified: Secondary | ICD-10-CM | POA: Insufficient documentation

## 2018-07-01 DIAGNOSIS — Z923 Personal history of irradiation: Secondary | ICD-10-CM | POA: Insufficient documentation

## 2018-07-01 DIAGNOSIS — Z86000 Personal history of in-situ neoplasm of breast: Secondary | ICD-10-CM | POA: Diagnosis not present

## 2018-07-01 DIAGNOSIS — C50412 Malignant neoplasm of upper-outer quadrant of left female breast: Secondary | ICD-10-CM

## 2018-07-01 NOTE — Telephone Encounter (Signed)
Called patient re appt.  Mailed calendar.

## 2018-07-01 NOTE — Telephone Encounter (Signed)
Called patient with appt for next year.  Mailed calendar.

## 2018-07-02 NOTE — Progress Notes (Deleted)
Office Visit Note  Patient: Kim Porter             Date of Birth: May 14, 1966           MRN: [id number]             PCP: Jefm Petty, MD Referring: Jefm Petty, MD Visit Date: 07/15/2018 Occupation: @GUAROCC @  Subjective:  No chief complaint on file.   History of Present Illness: Kim Porter is a 52 y.o. female ***   Activities of Daily Living:  Patient reports morning stiffness for *** {minute/hour:19697}.   Patient {ACTIONS;DENIES/REPORTS:21021675::"Denies"} nocturnal pain.  Difficulty dressing/grooming: {ACTIONS;DENIES/REPORTS:21021675::"Denies"} Difficulty climbing stairs: {ACTIONS;DENIES/REPORTS:21021675::"Denies"} Difficulty getting out of chair: {ACTIONS;DENIES/REPORTS:21021675::"Denies"} Difficulty using hands for taps, buttons, cutlery, and/or writing: {ACTIONS;DENIES/REPORTS:21021675::"Denies"}  No Rheumatology ROS completed.   PMFS History:  Patient Active Problem List   Diagnosis Date Noted  . Effusion, left knee 11/23/2016  . High risk medication use 11/23/2016  . Trigger ring finger of right hand 11/23/2016  . History of right Achilles tendinitis 11/23/2016  . Primary osteoarthritis of both knees 11/23/2016  . Primary osteoarthritis of both hands 11/23/2016  . Genetic testing 09/06/2015  . Family history of breast cancer in mother 08/25/2015  . Family history of breast cancer in female 08/25/2015  . Rheumatoid arthritis ([facility]) 08/16/2015  . Malignant neoplasm of upper-outer quadrant of left breast in female, estrogen receptor positive ([facility]) 08/12/2015    Past Medical History:  Diagnosis Date  . Breast cancer of upper-outer quadrant of left female breast ([facility]) 08/12/2015  . Personal history of radiation therapy 2016  . Radiation 10/18/15-11/11/15   left breast 42.5 Gy, left breast boost 7.5 Gy  . Rheumatoid arthritis ([place])     Family History  Problem Relation Age of Onset  . Breast cancer Mother 18  . Colon polyps Mother 5  . Other Sister     has had "7 lumpectomies, all benign"  . Diabetes Sister   . Cancer Maternal Grandmother        unknown primary - metastasized  . Emphysema Maternal Grandfather   . Cancer Maternal Uncle 61       unspecified type; "caused by agent orange"  . Breast cancer Other        (x2); dx. 58 and 75  . Breast cancer Cousin 66       multiple primaries; negative genetic testing   . Alzheimer's disease Other    Past Surgical History:  Procedure Laterality Date  . BREAST LUMPECTOMY Left 2016  . BREAST LUMPECTOMY WITH RADIOACTIVE SEED LOCALIZATION Left 09/02/2015   Procedure: LEFT BREAST LUMPECTOMY WITH RADIOACTIVE SEED LOCALIZATION;  Surgeon: Fanny Skates, MD;  Location: [facility];  Service: General;  Laterality: Left;  . EYE SURGERY    . Lasix surgery bilateral      Social History   Social History Narrative  . Not on file    Objective: Vital Signs: There were no vitals taken for this visit.   Physical Exam   Musculoskeletal Exam: ***  CDAI Exam: No CDAI exam completed.   Investigation: No additional findings.  Imaging: No results found.  Recent Labs: Lab Results  Component Value Date   WBC 5.9 04/29/2018   HGB 12.7 04/29/2018   PLT 256 04/29/2018   NA 139 04/29/2018   K 4.8 04/29/2018   CL 105 04/29/2018   CO2 27 04/29/2018   GLUCOSE 120 (H) 04/29/2018   BUN 17 04/29/2018   CREATININE 0.78 04/29/2018   BILITOT 0.3 04/29/2018  ALKPHOS 56 05/10/2017   AST 22 04/29/2018   ALT 22 04/29/2018   PROT 7.1 04/29/2018   ALBUMIN 4.2 05/10/2017   CALCIUM 9.5 04/29/2018   GFRAA 102 04/29/2018    Speciality Comments: No specialty comments available.  Procedures:  No procedures performed Allergies: Patient has no known allergies.   Assessment / Plan:     Visit Diagnoses: No diagnosis found.   Orders: No orders of the defined types were placed in this encounter.  No orders of the defined types were placed in this encounter.   Face-to-face time spent  with patient was *** minutes. Greater than 50% of time was spent in counseling and coordination of care.  Follow-Up Instructions: No follow-ups on file.   Earnestine Mealing, CMA  Note - This record has been created using Editor, commissioning.  Chart creation errors have been sought, but may not always  have been located. Such creation errors do not reflect on  the standard of medical care.

## 2018-07-15 ENCOUNTER — Ambulatory Visit: Payer: BLUE CROSS/BLUE SHIELD | Admitting: Rheumatology

## 2018-07-28 NOTE — Progress Notes (Deleted)
Office Visit Note  Patient: Kim Porter             Date of Birth: [birth year]           MRN: [id number]             PCP: Jefm Petty, MD Referring: Jefm Petty, MD Visit Date: 08/08/2018 Occupation: @GUAROCC @  Subjective:  No chief complaint on file.   History of Present Illness: Kim Porter is a 52 y.o. female ***   Activities of Daily Living:  Patient reports morning stiffness for *** {minute/hour:19697}.   Patient {ACTIONS;DENIES/REPORTS:21021675::"Denies"} nocturnal pain.  Difficulty dressing/grooming: {ACTIONS;DENIES/REPORTS:21021675::"Denies"} Difficulty climbing stairs: {ACTIONS;DENIES/REPORTS:21021675::"Denies"} Difficulty getting out of chair: {ACTIONS;DENIES/REPORTS:21021675::"Denies"} Difficulty using hands for taps, buttons, cutlery, and/or writing: {ACTIONS;DENIES/REPORTS:21021675::"Denies"}  No Rheumatology ROS completed.   PMFS History:  Patient Active Problem List   Diagnosis Date Noted  . Effusion, left knee 11/23/2016  . High risk medication use 11/23/2016  . Trigger ring finger of right hand 11/23/2016  . History of right Achilles tendinitis 11/23/2016  . Primary osteoarthritis of both knees 11/23/2016  . Primary osteoarthritis of both hands 11/23/2016  . Genetic testing 09/06/2015  . Family history of breast cancer in mother 08/25/2015  . Family history of breast cancer in female 08/25/2015  . Rheumatoid arthritis ([place]) 08/16/2015  . Malignant neoplasm of upper-outer quadrant of left breast in female, estrogen receptor positive ([place]) 08/12/2015    Past Medical History:  Diagnosis Date  . Breast cancer of upper-outer quadrant of left female breast ([facility]) 08/12/2015  . Personal history of radiation therapy 2016  . Radiation 10/18/15-11/11/15   left breast 42.5 Gy, left breast boost 7.5 Gy  . Rheumatoid arthritis ([place])     Family History  Problem Relation Age of Onset  . Breast cancer Mother 30  . Colon polyps Mother 40  . Other Sister     has had "7 lumpectomies, all benign"  . Diabetes Sister   . Cancer Maternal Grandmother        unknown primary - metastasized  . Emphysema Maternal Grandfather   . Cancer Maternal Uncle 61       unspecified type; "caused by agent orange"  . Breast cancer Other        (x2); dx. 51 and 75  . Breast cancer Cousin 66       multiple primaries; negative genetic testing   . Alzheimer's disease Other    Past Surgical History:  Procedure Laterality Date  . BREAST LUMPECTOMY Left 2016  . BREAST LUMPECTOMY WITH RADIOACTIVE SEED LOCALIZATION Left 09/02/2015   Procedure: LEFT BREAST LUMPECTOMY WITH RADIOACTIVE SEED LOCALIZATION;  Surgeon: Fanny Skates, MD;  Location: [facility];  Service: General;  Laterality: Left;  . EYE SURGERY    . Lasix surgery bilateral      Social History   Social History Narrative  . Not on file    Objective: Vital Signs: There were no vitals taken for this visit.   Physical Exam   Musculoskeletal Exam: ***  CDAI Exam: CDAI Score: Not documented Patient Global Assessment: Not documented; Provider Global Assessment: Not documented Swollen: Not documented; Tender: Not documented Joint Exam   Not documented   There is currently no information documented on the homunculus. Go to the Rheumatology activity and complete the homunculus joint exam.  Investigation: No additional findings.  Imaging: No results found.  Recent Labs: Lab Results  Component Value Date   WBC 5.9 04/29/2018   HGB 12.7 04/29/2018   PLT  256 04/29/2018   NA 139 04/29/2018   K 4.8 04/29/2018   CL 105 04/29/2018   CO2 27 04/29/2018   GLUCOSE 120 (H) 04/29/2018   BUN 17 04/29/2018   CREATININE 0.78 04/29/2018   BILITOT 0.3 04/29/2018   ALKPHOS 56 05/10/2017   AST 22 04/29/2018   ALT 22 04/29/2018   PROT 7.1 04/29/2018   ALBUMIN 4.2 05/10/2017   CALCIUM 9.5 04/29/2018   GFRAA 102 04/29/2018    Speciality Comments: No specialty comments  available.  Procedures:  No procedures performed Allergies: Patient has no known allergies.   Assessment / Plan:     Visit Diagnoses: No diagnosis found.   Orders: No orders of the defined types were placed in this encounter.  No orders of the defined types were placed in this encounter.   Face-to-face time spent with patient was *** minutes. Greater than 50% of time was spent in counseling and coordination of care.  Follow-Up Instructions: No follow-ups on file.   Earnestine Mealing, CMA  Note - This record has been created using Editor, commissioning.  Chart creation errors have been sought, but may not always  have been located. Such creation errors do not reflect on  the standard of medical care.

## 2018-07-30 ENCOUNTER — Ambulatory Visit
Admission: RE | Admit: 2018-07-30 | Discharge: 2018-07-30 | Disposition: A | Discharge status: Home / Self Care | Payer: BLUE CROSS/BLUE SHIELD | Source: Ambulatory Visit | Attending: Oncology | Admitting: Oncology

## 2018-07-30 DIAGNOSIS — Z853 Personal history of malignant neoplasm of breast: Secondary | ICD-10-CM

## 2018-08-06 NOTE — Patient Instructions (Signed)
Standing Labs We placed an order today for your standing lab work.    Please come back and get your standing labs every 3 months.  We have open lab Monday through Friday from 8:30-11:30 AM and 1:30-4:00 PM  at the office of Dr. Bo Merino.   You may experience shorter wait times on Monday and Friday afternoons. The office is located at [address], [place], [place], [place] [address] No appointment is necessary.   Labs are drawn by [facility].  You may receive a bill from [facility] for your lab work. If you have any questions regarding directions or hours of operation,  please call [phone].    Vaccines You are taking a medication(s) that can suppress your immune system.  The following immunizations are recommended:  . Flu annually . Pneumonia . Shingles . Hepatitis B  Please check with your PCP to make sure you are up to date.

## 2018-08-08 ENCOUNTER — Encounter: Payer: BLUE CROSS/BLUE SHIELD | Admitting: Rheumatology

## 2018-08-12 NOTE — Progress Notes (Signed)
This encounter was created in error - please disregard.

## 2018-08-13 ENCOUNTER — Ambulatory Visit: Payer: BLUE CROSS/BLUE SHIELD | Admitting: Oncology

## 2018-08-14 ENCOUNTER — Other Ambulatory Visit: Payer: Self-pay

## 2018-08-14 ENCOUNTER — Other Ambulatory Visit: Payer: Self-pay | Admitting: Rheumatology

## 2018-08-14 DIAGNOSIS — Z79899 Other long term (current) drug therapy: Secondary | ICD-10-CM

## 2018-08-14 NOTE — Telephone Encounter (Signed)
Patient came in for labs today, they are pending.   Okay to refill per Dr. Estanislado Pandy.

## 2018-08-14 NOTE — Telephone Encounter (Signed)
Last visit: 05/10/2017 Next visit: message sent to the front desk to schedule patient.  Labs: 04/29/2018 Glucose 120. All other lab values are WNL  Advised patient she is due to update labs, patient states she will be coming in this morning.

## 2018-08-15 LAB — CBC WITH DIFFERENTIAL/PLATELET
BASOS PCT: 1.3 %
Basophils Absolute: 83 cells/uL (ref 0–200)
EOS PCT: 3.5 %
Eosinophils Absolute: 224 cells/uL (ref 15–500)
HEMATOCRIT: 35.5 % (ref 35.0–45.0)
HEMOGLOBIN: 12 g/dL (ref 11.7–15.5)
LYMPHS ABS: 1581 {cells}/uL (ref 850–3900)
MCH: 33.4 pg — AB (ref 27.0–33.0)
MCHC: 33.8 g/dL (ref 32.0–36.0)
MCV: 98.9 fL (ref 80.0–100.0)
MONOS PCT: 7.1 %
MPV: 11.8 fL (ref 7.5–12.5)
Neutro Abs: 4058 cells/uL (ref 1500–7800)
Neutrophils Relative %: 63.4 %
Platelets: 297 10*3/uL (ref 140–400)
RBC: 3.59 10*6/uL — AB (ref 3.80–5.10)
RDW: 12.7 % (ref 11.0–15.0)
Total Lymphocyte: 24.7 %
WBC mixed population: 454 cells/uL (ref 200–950)
WBC: 6.4 10*3/uL (ref 3.8–10.8)

## 2018-08-15 LAB — COMPLETE METABOLIC PANEL WITH GFR
AG Ratio: 1.6 (calc) (ref 1.0–2.5)
ALBUMIN MSPROF: 4.3 g/dL (ref 3.6–5.1)
ALT: 35 U/L — AB (ref 6–29)
AST: 39 U/L — ABNORMAL HIGH (ref 10–35)
Alkaline phosphatase (APISO): 57 U/L (ref 33–130)
BUN: 10 mg/dL (ref 7–25)
CO2: 25 mmol/L (ref 20–32)
Calcium: 9.3 mg/dL (ref 8.6–10.4)
Chloride: 104 mmol/L (ref 98–110)
Creat: 0.75 mg/dL (ref 0.50–1.05)
GFR, EST AFRICAN AMERICAN: 107 mL/min/{1.73_m2} (ref >=60)
GFR, Est Non African American: 92 mL/min/{1.73_m2} (ref >=60)
GLOBULIN: 2.7 g/dL (ref 1.9–3.7)
Glucose, Bld: 89 mg/dL (ref 65–99)
Potassium: 4.8 mmol/L (ref 3.5–5.3)
SODIUM: 138 mmol/L (ref 135–146)
Total Bilirubin: 0.4 mg/dL (ref 0.2–1.2)
Total Protein: 7 g/dL (ref 6.1–8.1)

## 2018-08-15 NOTE — Progress Notes (Signed)
LFTs are mildly elevated.  Please advise patient to reduce sulfasalazine to 1 tablet p.o. twice daily.

## 2018-08-18 ENCOUNTER — Telehealth: Payer: Self-pay | Admitting: Rheumatology

## 2018-08-18 MED ORDER — METHOTREXATE SODIUM CHEMO INJECTION 50 MG/2ML: 20.0000 mg | INTRAMUSCULAR | 0 refills | Status: DC

## 2018-08-18 NOTE — Telephone Encounter (Signed)
Patient called requesting prescription refill of Methotrexate to be sent to [facility] on [address] in [place].

## 2018-08-18 NOTE — Telephone Encounter (Signed)
Last visit: 05/10/2017 Next visit: 10/14/18 Labs: 08/14/18 LFTs are mildly elevated.  Okay to refill per Dr. Estanislado Pandy

## 2018-08-26 ENCOUNTER — Encounter: Payer: Self-pay | Admitting: Physician Assistant

## 2018-08-26 ENCOUNTER — Ambulatory Visit: Payer: BLUE CROSS/BLUE SHIELD | Admitting: Rheumatology

## 2018-08-26 VITALS — BP 124/75 | HR 75 | Ht 70.0 in | Wt 238.0 lb

## 2018-08-26 DIAGNOSIS — Z170 Estrogen receptor positive status [ER+]: Secondary | ICD-10-CM

## 2018-08-26 DIAGNOSIS — M170 Bilateral primary osteoarthritis of knee: Secondary | ICD-10-CM | POA: Diagnosis not present

## 2018-08-26 DIAGNOSIS — M19041 Primary osteoarthritis, right hand: Secondary | ICD-10-CM

## 2018-08-26 DIAGNOSIS — M0609 Rheumatoid arthritis without rheumatoid factor, multiple sites: Secondary | ICD-10-CM

## 2018-08-26 DIAGNOSIS — Z79899 Other long term (current) drug therapy: Secondary | ICD-10-CM | POA: Diagnosis not present

## 2018-08-26 DIAGNOSIS — M19042 Primary osteoarthritis, left hand: Secondary | ICD-10-CM

## 2018-08-26 DIAGNOSIS — C50412 Malignant neoplasm of upper-outer quadrant of left female breast: Secondary | ICD-10-CM

## 2018-08-26 MED ORDER — PREDNISONE 5 MG PO TABS: ORAL_TABLET | ORAL | 0 refills | Status: DC

## 2018-08-26 NOTE — Patient Instructions (Signed)
Adalimumab Injection What is this medicine? ADALIMUMAB (a dal AYE mu mab) is used to treat rheumatoid and psoriatic arthritis. It is also used to treat ankylosing spondylitis, Crohn's disease, ulcerative colitis, plaque psoriasis, hidradenitis suppurativa, and uveitis. This medicine may be used for other purposes; ask your health care provider or pharmacist if you have questions. COMMON BRAND NAME(S): CYLTEZO, Humira What should I tell my health care provider before I take this medicine? They need to know if you have any of these conditions: -diabetes -heart disease -hepatitis B or history of hepatitis B infection -immune system problems -infection or history of infections -multiple sclerosis -recently received or scheduled to receive a vaccine -scheduled to have surgery -tuberculosis, a positive skin test for tuberculosis or have recently been in close contact with someone who has tuberculosis -an unusual reaction to adalimumab, other medicines, mannitol, latex, rubber, foods, dyes, or preservatives -pregnant or trying to get pregnant -breast-feeding How should I use this medicine? This medicine is for injection under the skin. You will be taught how to prepare and give this medicine. Use exactly as directed. Take your medicine at regular intervals. Do not take your medicine more often than directed. A special MedGuide will be given to you by the pharmacist with each prescription and refill. Be sure to read this information carefully each time. It is important that you put your used needles and syringes in a special sharps container. Do not put them in a trash can. If you do not have a sharps container, call your pharmacist or healthcare provider to get one. Talk to your pediatrician regarding the use of this medicine in children. While this drug may be prescribed for children as young as 2 years for selected conditions, precautions do apply. The manufacturer of the medicine offers free  information to patients and their health care partners. Call 1-800-448-6472 for more information. Overdosage: If you think you have taken too much of this medicine contact a poison control center or emergency room at once. NOTE: This medicine is only for you. Do not share this medicine with others. What if I miss a dose? If you miss a dose, take it as soon as you can. If it is almost time for your next dose, take only that dose. Do not take double or extra doses. Give the next dose when your next scheduled dose is due. Call your doctor or health care professional if you are not sure how to handle a missed dose. What may interact with this medicine? Do not take this medicine with any of the following medications: -abatacept -anakinra -etanercept -infliximab -live virus vaccines -rilonacept This medicine may also interact with the following medications: -vaccines This list may not describe all possible interactions. Give your health care provider a list of all the medicines, herbs, non-prescription drugs, or dietary supplements you use. Also tell them if you smoke, drink alcohol, or use illegal drugs. Some items may interact with your medicine. What should I watch for while using this medicine? Visit your doctor or health care professional for regular checks on your progress. Tell your doctor or healthcare professional if your symptoms do not start to get better or if they get worse. You will be tested for tuberculosis (TB) before you start this medicine. If your doctor prescribes any medicine for TB, you should start taking the TB medicine before starting this medicine. Make sure to finish the full course of TB medicine. Call your doctor or health care professional if you get a cold   or other infection while receiving this medicine. Do not treat yourself. This medicine may decrease your body's ability to fight infection. Talk to your doctor about your risk of cancer. You may be more at risk for  certain types of cancers if you take this medicine. What side effects may I notice from receiving this medicine? Side effects that you should report to your doctor or health care professional as soon as possible: -allergic reactions like skin rash, itching or hives, swelling of the face, lips, or tongue -breathing problems -changes in vision -chest pain -fever, chills, or any other sign of infection -numbness or tingling -red, scaly patches or raised bumps on the skin -swelling of the ankles -swollen lymph nodes in the neck, underarm, or groin areas -unexplained weight loss -unusual bleeding or bruising -unusually weak or tired Side effects that usually do not require medical attention (report to your doctor or health care professional if they continue or are bothersome): -headache -nausea -redness, itching, swelling, or bruising at site where injected This list may not describe all possible side effects. Call your doctor for medical advice about side effects. You may report side effects to FDA at 1-800-FDA-1088. Where should I keep my medicine? Keep out of the reach of children. Store in the original container and in the refrigerator between 2 and 8 degrees C (36 and 46 degrees F). Do not freeze. The product may be stored in a cool carrier with an ice pack, if needed. Protect from light. Throw away any unused medicine after the expiration date. NOTE: This sheet is a summary. It may not cover all possible information. If you have questions about this medicine, talk to your doctor, pharmacist, or health care provider.  2018 Elsevier/Gold Standard (2015-06-15 11:11:43)  

## 2018-08-26 NOTE — Progress Notes (Signed)
Office Visit Note  Patient: Kim Porter             Date of Birth: 07/27/1966           MRN: [id number]             PCP: Jefm Petty, MD Referring: Jefm Petty, MD Visit Date: 08/26/2018 Occupation: @GUAROCC @  Subjective:  Pain and swelling of finger  History of Present Illness: Kim Porter is a 52 y.o. female with history of sero positive rheumatoid factor and osteoarthritis.  She states for last 3 to 4 weeks she has been having pain and swelling of her left index finger.  She is having difficulty extending the finger.  She has had some discomfort in her right heel.  None of the other joints are painful or swollen.  She has been experiencing some discomfort in her right upper arm.  Activities of Daily Living:  Patient reports morning stiffness for 2-3  hours.   Patient Denies nocturnal pain.  Difficulty dressing/grooming: Reports Difficulty climbing stairs: Denies Difficulty getting out of chair: Denies Difficulty using hands for taps, buttons, cutlery, and/or writing: Reports  Review of Systems  Constitutional: Negative for fatigue, night sweats, weight gain and weight loss.  HENT: Negative for mouth sores, trouble swallowing, trouble swallowing, mouth dryness and nose dryness.   Eyes: Negative for pain, redness, visual disturbance and dryness.  Respiratory: Negative for cough, shortness of breath and difficulty breathing.   Cardiovascular: Negative for chest pain, palpitations, hypertension, irregular heartbeat and swelling in legs/feet.  Gastrointestinal: Negative for blood in stool, constipation and diarrhea.  Endocrine: Negative for increased urination.  Genitourinary: Negative for difficulty urinating and vaginal dryness.  Musculoskeletal: Positive for arthralgias, joint pain, joint swelling, myalgias, morning stiffness and myalgias. Negative for muscle weakness and muscle tenderness.  Skin: Negative for color change, rash, hair loss, skin tightness, ulcers and  sensitivity to sunlight.  Allergic/Immunologic: Negative for susceptible to infections.  Neurological: Negative for dizziness, numbness, headaches, memory loss, night sweats and weakness.  Hematological: Negative for swollen glands.  Psychiatric/Behavioral: Negative for depressed mood and sleep disturbance. The patient is not nervous/anxious.     PMFS History:  Patient Active Problem List   Diagnosis Date Noted  . Effusion, left knee 11/23/2016  . High risk medication use 11/23/2016  . Trigger ring finger of right hand 11/23/2016  . History of right Achilles tendinitis 11/23/2016  . Primary osteoarthritis of both knees 11/23/2016  . Primary osteoarthritis of both hands 11/23/2016  . Genetic testing 09/06/2015  . Family history of breast cancer in mother 08/25/2015  . Family history of breast cancer in female 08/25/2015  . Rheumatoid arthritis ([place]) 08/16/2015  . Malignant neoplasm of upper-outer quadrant of left breast in female, estrogen receptor positive ([place]) 08/12/2015    Past Medical History:  Diagnosis Date  . Breast cancer of upper-outer quadrant of left female breast ([place]) 08/12/2015  . Personal history of radiation therapy 2016  . Radiation 10/18/15-11/11/15   left breast 42.5 Gy, left breast boost 7.5 Gy  . Rheumatoid arthritis ([place])     Family History  Problem Relation Age of Onset  . Breast cancer Mother 75  . Colon polyps Mother 20  . Other Sister        has had "7 lumpectomies, all benign"  . Diabetes Sister   . Cancer Maternal Grandmother        unknown primary - metastasized  . Emphysema Maternal Grandfather   . Cancer Maternal Uncle 28  unspecified type; "caused by agent orange"  . Breast cancer Other        (x2); dx. 106 and 75  . Breast cancer Cousin 66       multiple primaries; negative genetic testing   . Alzheimer's disease Other    Past Surgical History:  Procedure Laterality Date  . BREAST LUMPECTOMY Left 2016  . BREAST LUMPECTOMY WITH  RADIOACTIVE SEED LOCALIZATION Left 09/02/2015   Procedure: LEFT BREAST LUMPECTOMY WITH RADIOACTIVE SEED LOCALIZATION;  Surgeon: Fanny Skates, MD;  Location: [facility];  Service: General;  Laterality: Left;  . EYE SURGERY    . Lasix surgery bilateral      Social History   Social History Narrative  . Not on file    Objective: Vital Signs: BP 124/75 (BP Location: Right Arm, Patient Position: Sitting, Cuff Size: Normal)   Pulse 75   Ht 5\' 10"  (1.778 m)   Wt 238 lb (108 kg)   BMI 34.15 kg/m    Physical Exam  Constitutional: She is oriented to person, place, and time. She appears well-developed and well-nourished.  HENT:  Head: Normocephalic and atraumatic.  Eyes: Conjunctivae and EOM are normal.  Neck: Normal range of motion.  Cardiovascular: Normal rate, regular rhythm, normal heart sounds and intact distal pulses.  Pulmonary/Chest: Effort normal and breath sounds normal.  Abdominal: Soft. Bowel sounds are normal.  Lymphadenopathy:    She has no cervical adenopathy.  Neurological: She is alert and oriented to person, place, and time.  Skin: Skin is warm and dry. Capillary refill takes less than 2 seconds.  Psychiatric: She has a normal mood and affect. Her behavior is normal.  Nursing note and vitals reviewed.    Musculoskeletal Exam: C-spine thoracic lumbar spine good range of motion.  Shoulder joints elbow joints wrist joints are in good range of motion.  She has warmth and swelling of her left second PIP and MCP joint.  Right hand wrist joint MCPs PIPs were in good range of motion.  She has PIP and DIP thickening in her hands and feet due to osteoarthritis.  Hip joints knee joints ankles MTPs PIPs were in good range of motion.  CDAI Exam: CDAI Score: 2.8  Patient Global Assessment: 4 (mm); Provider Global Assessment: 4 (mm) Swollen: 1 ; Tender: 1  Joint Exam      Right  Left  PIP 2     Swollen Tender     Investigation: No additional  findings.  Imaging: Mm Diag Breast Tomo Bilateral  Result Date: 07/30/2018 CLINICAL DATA:  51 year old female status post malignant left lumpectomy with radiation therapy in 2016. No current symptoms. EXAM: DIGITAL DIAGNOSTIC BILATERAL MAMMOGRAM WITH CAD AND TOMO COMPARISON:  Previous exam(s). ACR Breast Density Category c: The breast tissue is heterogeneously dense, which may obscure small masses. FINDINGS: Stable postsurgical changes are noted on the left. No new or suspicious mammographic findings are identified in either breast. Mammographic images were processed with CAD. IMPRESSION: 1. No mammographic evidence of malignancy in either breast. 2. Stable left breast posttreatment changes. RECOMMENDATION: Diagnostic mammogram is suggested in 1 year. (Code:DM-B-01Y) I have discussed the findings and recommendations with the patient. Results were also provided in writing at the conclusion of the visit. If applicable, a reminder letter will be sent to the patient regarding the next appointment. BI-RADS CATEGORY  2: Benign. Electronically Signed   By: Kristopher Oppenheim M.D.   On: 07/30/2018 15:38    Recent Labs: Lab Results  Component Value  Date   WBC 6.4 08/14/2018   HGB 12.0 08/14/2018   PLT 297 08/14/2018   NA 138 08/14/2018   K 4.8 08/14/2018   CL 104 08/14/2018   CO2 25 08/14/2018   GLUCOSE 89 08/14/2018   BUN 10 08/14/2018   CREATININE 0.75 08/14/2018   BILITOT 0.4 08/14/2018   ALKPHOS 56 05/10/2017   AST 39 (H) 08/14/2018   ALT 35 (H) 08/14/2018   PROT 7.0 08/14/2018   ALBUMIN 4.2 05/10/2017   CALCIUM 9.3 08/14/2018   GFRAA 107 08/14/2018    Speciality Comments: No specialty comments available.  Procedures:  No procedures performed Allergies: Patient has no known allergies.   Assessment / Plan:     Visit Diagnoses: Rheumatoid arthritis of multiple sites with negative rheumatoid factor (HCC)-patient is having frequent flares of rheumatoid arthritis.  She has pain and swelling  in her left second index finger today.  She has been having infrequent pain in her feet as well.  She has been on methotrexate and sulfasalazine combination.  Recently her liver functions were elevated and we had to reduce her sulfasalazine dose.  She has done extremely well on Humira in the past.  Humira was discontinued after the diagnosis of breast cancer.  I would like to resume Humira if approved by Dr. Jana Hakim.  I will send a note to him.  If he believes that Humira is not the best choice Orencia could be the second choice for her.  I will give her a prednisone taper for her left index finger as she is having difficulty bending her finger.  Handout on Humira was given.  Side effects were reviewed.  Medication counseling:   TB Test: 07/12/2015 (patient will need repeat TB gold prior to starting biologic DMARD.) Hepatitis panel: 06/30/2013 HIV: 06/30/2013 SPEP: 06/30/2013 Immunoglobulins: 06/30/2013  Chest x-ray: 06/30/2013  Does patient have diagnosis of heart failure?  No  Counseled patient that Humira is a TNF blocking agent.  Reviewed Humira dose of 40 mg every other week.  Counseled patient on purpose, proper use, and adverse effects of Humira.  Reviewed the most common adverse effects including infections, headache, and injection site reactions. Discussed that there is the possibility of an increased risk of malignancy but it is not well understood if this increased risk is due to the medication or the disease state.  Advised patient to get yearly dermatology exams due to risk of skin cancer.  Reviewed the importance of regular labs while on Humira therapy.  Advised patient to get standing labs one month after starting Humira then every 3 months.  Provided patient with standing lab orders.  Counseled patient that Humira should be held prior to scheduled surgery.  Counseled patient to avoid live vaccines while on Humira.  Advised patient to get annual influenza vaccine and the pneumococcal vaccine as  needed.  Provided patient with medication education material and answered all questions.  Patient voiced understanding.  Patient consented to Humira.  Will upload consent into the media tab.  Reviewed storage instructions of Humira.  Advised initial injection must be administered in office.    Patient voiced understanding.     High risk medication use - Methotrexate subcutaneous0.8 ml sq q wk, sulfasalazine 500 mg , 2 tabs po bid, folic acid 2mg  po qd -labs are stable except for mild elevation of LFTs.  Primary osteoarthritis of both hands-chronic pain  Primary osteoarthritis of both knees-chronic pain  Malignant neoplasm of upper-outer quadrant of left breast in female, estrogen  receptor positive [facility]) -   September 2016, partial mastectomy, radiation therapy for 1 month, on tamoxifen followed up by Dr. Jana Hakim   Orders: No orders of the defined types were placed in this encounter.  Meds ordered this encounter  Medications  . predniSONE (DELTASONE) 5 MG tablet    Sig: Take 4 tabs po x 4 days, 3  tabs po x 4 days, 2  tabs po x 4 days, 1  tab po x 4 days    Dispense:  40 tablet    Refill:  0    Face-to-face time spent with patient was 30 minutes. Greater than 50% of time was spent in counseling and coordination of care.  Follow-Up Instructions: Return in about 3 months (around 11/25/2018) for Rheumatoid arthritis.   Bo Merino, MD  Note - This record has been created using Editor, commissioning.  Chart creation errors have been sought, but may not always  have been located. Such creation errors do not reflect on  the standard of medical care.

## 2018-09-07 ENCOUNTER — Other Ambulatory Visit: Payer: Self-pay | Admitting: Oncology

## 2018-09-08 ENCOUNTER — Telehealth: Payer: Self-pay | Admitting: *Deleted

## 2018-09-08 ENCOUNTER — Telehealth: Payer: Self-pay | Admitting: Pharmacy Technician

## 2018-09-08 DIAGNOSIS — M0609 Rheumatoid arthritis without rheumatoid factor, multiple sites: Secondary | ICD-10-CM

## 2018-09-08 NOTE — Telephone Encounter (Signed)
Received a Prior Authorization request from Dr. Estanislado Pandy for Humira. Authorization has been submitted to patient's insurance via Cover My Meds. Will update once we receive a response.  2:04 PM Beatriz Chancellor, CPhT

## 2018-09-08 NOTE — Telephone Encounter (Signed)
-----   Message from Carole Binning, LPN sent at 01/15/155  1:33 PM EDT ----- Regarding: FW: Restart on Humira   ----- Message ----- From: Chauncey Cruel, MD Sent: 09/07/2018   2:33 PM EDT To: Carole Binning, LPN Subject: RE: Restart on [name] had non-invasive (not life threatening) breast cancer. I wouyld treat her with the agent that will best control her autoimmune disease  Thanks!  GM ----- Message ----- From: Carole Binning, LPN Sent: 1/53/7943   4:26 PM EDT To: Chauncey Cruel, MD Subject: Restart on Humira                              Dr. Jana Hakim,  We have this mutual patient Kim Porter. Kim Porter was in our office on 08/26/18. Dr. Estanislado Pandy would like to restart her on Humira. Patient had a positive response to Humira prior to her breast cancer diagnosis. If the Humira is not a good option would Orencia be a better option.   Thanks!

## 2018-09-08 NOTE — Telephone Encounter (Addendum)
Please consent and apply for Humira per Dr. Estanislado Pandy

## 2018-09-09 NOTE — Telephone Encounter (Signed)
Patient return phone call.  Informed patient that her oncologist was okay with her restarting Humira.  Instructed patient that she must come into the office for her first dose.  Gave her the option to either start on a sample or wait until the prior authorization process is completed.  She would like to wait until medication is approved.  We will follow-up with patient once we receive a response on the PA.

## 2018-09-09 NOTE — Progress Notes (Signed)
Office Visit Note  Patient: Kim Porter             Date of Birth: 02/15/1966           MRN: [id number]             PCP: Jefm Petty, MD Referring: Jefm Petty, MD Visit Date: 09/10/2018 Occupation: @GUAROCC @  Subjective:  Left index finger pain   History of Present Illness: Kim Porter is a 52 y.o. female with history of seronegative rheumatoid arthritis and osteoarthritis.  She is taking MTX 0.8 ml sq once a week, SSZ 500 mg 1 tablets by mouth BID, and folic acid 2 mg daily.  She will be starting on Humira soon since she was cleared by Dr. Jana Hakim.  Patient reports that she is still on a prednisone taper.  She states that she is having discomfort in her left index finger.  She states that she has tenderness and swelling on the palmar aspect.  She would like a cortisone injection today.  She states she continues to have chronic lower back pain.  She says she is also having some discomfort in her right heel.  She denies any Achilles tenderness or plantar fasciitis.  She states that she did get a flu vaccination last week.    Activities of Daily Living:  Patient reports morning stiffness for 2 hours.   Patient Reports nocturnal pain.  Difficulty dressing/grooming: Denies Difficulty climbing stairs: Reports Difficulty getting out of chair: Reports Difficulty using hands for taps, buttons, cutlery, and/or writing: Reports  Review of Systems  Constitutional: Negative for fatigue.  HENT: Negative for mouth sores, mouth dryness and nose dryness.   Eyes: Negative for pain, visual disturbance and dryness.  Respiratory: Negative for cough, hemoptysis, shortness of breath and difficulty breathing.   Cardiovascular: Negative for chest pain, palpitations, hypertension and swelling in legs/feet.  Gastrointestinal: Negative for blood in stool, constipation and diarrhea.  Endocrine: Negative for increased urination.  Genitourinary: Negative for painful urination.  Musculoskeletal:  Positive for arthralgias, joint pain, joint swelling and morning stiffness. Negative for myalgias, muscle weakness, muscle tenderness and myalgias.  Skin: Negative for color change, pallor, rash, hair loss, nodules/bumps, skin tightness, ulcers and sensitivity to sunlight.  Allergic/Immunologic: Negative for susceptible to infections.  Neurological: Negative for dizziness, numbness, headaches and weakness.  Hematological: Negative for swollen glands.  Psychiatric/Behavioral: Negative for depressed mood and sleep disturbance. The patient is not nervous/anxious.     PMFS History:  Patient Active Problem List   Diagnosis Date Noted  . Effusion, left knee 11/23/2016  . High risk medication use 11/23/2016  . Trigger ring finger of right hand 11/23/2016  . History of right Achilles tendinitis 11/23/2016  . Primary osteoarthritis of both knees 11/23/2016  . Primary osteoarthritis of both hands 11/23/2016  . Genetic testing 09/06/2015  . Family history of breast cancer in mother 08/25/2015  . Family history of breast cancer in female 08/25/2015  . Rheumatoid arthritis ([place]) 08/16/2015  . Malignant neoplasm of upper-outer quadrant of left breast in female, estrogen receptor positive ([place]) 08/12/2015    Past Medical History:  Diagnosis Date  . Breast cancer of upper-outer quadrant of left female breast ([place]) 08/12/2015  . Personal history of radiation therapy 2016  . Radiation 10/18/15-11/11/15   left breast 42.5 Gy, left breast boost 7.5 Gy  . Rheumatoid arthritis ([place])     Family History  Problem Relation Age of Onset  . Breast cancer Mother 37  . Colon polyps Mother  33  . Other Sister        has had "7 lumpectomies, all benign"  . Diabetes Sister   . Cancer Maternal Grandmother        unknown primary - metastasized  . Emphysema Maternal Grandfather   . Cancer Maternal Uncle 61       unspecified type; "caused by agent orange"  . Breast cancer Other        (x2); dx. 89 and 75  .  Breast cancer Cousin 66       multiple primaries; negative genetic testing   . Alzheimer's disease Other    Past Surgical History:  Procedure Laterality Date  . BREAST LUMPECTOMY Left 2016  . BREAST LUMPECTOMY WITH RADIOACTIVE SEED LOCALIZATION Left 09/02/2015   Procedure: LEFT BREAST LUMPECTOMY WITH RADIOACTIVE SEED LOCALIZATION;  Surgeon: Fanny Skates, MD;  Location: [place];  Service: General;  Laterality: Left;  . EYE SURGERY    . Lasix surgery bilateral      Social History   Social History Narrative  . Not on file    Objective: Vital Signs: BP 115/76 (BP Location: Right Arm, Patient Position: Sitting, Cuff Size: Normal)   Pulse 79   Resp 13   Ht 5\' 10"  (1.778 m)   Wt 236 lb (107 kg)   BMI 33.86 kg/m    Physical Exam  Constitutional: She is oriented to person, place, and time. She appears well-developed and well-nourished.  HENT:  Head: Normocephalic and atraumatic.  Eyes: Conjunctivae and EOM are normal.  Neck: Normal range of motion.  Cardiovascular: Normal rate, regular rhythm, normal heart sounds and intact distal pulses.  Pulmonary/Chest: Effort normal and breath sounds normal.  Abdominal: Soft. Bowel sounds are normal.  Lymphadenopathy:    She has no cervical adenopathy.  Neurological: She is alert and oriented to person, place, and time.  Skin: Skin is warm and dry. Capillary refill takes less than 2 seconds.  Psychiatric: She has a normal mood and affect. Her behavior is normal.  Nursing note and vitals reviewed.    Musculoskeletal Exam: C-spine, thoracic spine, lumbar spine good range of motion.  No midline spinal tenderness.  No SI joint tenderness.  Shoulder joints, elbow joints and wrist joints good range of motion with no synovitis.  She has tenderness and synovitis of the left second MCP and PIP joint.  She has PIP and DIP synovial thickening consistent with osteoarthritis of bilateral hands. Left second finger flexor tenosynovitis.   Hip joints, knee joints, ankle joints, MTPs, PIPs, DIPs good range of motion no synovitis.  She has PIP and DIP synovial thickening consistent with osteoarthritis of bilateral feet.  No tenderness of trochanter bursa bilaterally.  CDAI Exam: CDAI Score: 2.8  Patient Global Assessment: 4 (mm); Provider Global Assessment: 4 (mm) Swollen: 1 ; Tender: 1  Joint Exam      Right  Left  MCP 2     Swollen Tender     Investigation: No additional findings.  Imaging: No results found.  Recent Labs: Lab Results  Component Value Date   WBC 6.4 08/14/2018   HGB 12.0 08/14/2018   PLT 297 08/14/2018   NA 138 08/14/2018   K 4.8 08/14/2018   CL 104 08/14/2018   CO2 25 08/14/2018   GLUCOSE 89 08/14/2018   BUN 10 08/14/2018   CREATININE 0.75 08/14/2018   BILITOT 0.4 08/14/2018   ALKPHOS 56 05/10/2017   AST 39 (H) 08/14/2018   ALT 35 (H) 08/14/2018  PROT 7.0 08/14/2018   ALBUMIN 4.2 05/10/2017   CALCIUM 9.3 08/14/2018   GFRAA 107 08/14/2018    Speciality Comments: No specialty comments available.  Procedures:  Hand/UE Inj: L index A1 for trigger finger on 09/10/2018 5:06 PM Indications: pain, tendon swelling and therapeutic Details: 27 G needle, ultrasound-guided volar approach Medications: 0.5 mL lidocaine 1 %; 10 mg triamcinolone acetonide 40 MG/ML Aspirate: 0 mL Outcome: tolerated well, no immediate complications Procedure, treatment alternatives, risks and benefits explained, specific risks discussed. Consent was given by the patient. Immediately prior to procedure a time out was called to verify the correct patient, procedure, equipment, support staff and site/side marked as required. Patient was prepped and draped in the usual sterile fashion.     Allergies: Patient has no known allergies.   Assessment / Plan:     Visit Diagnoses: Rheumatoid arthritis of multiple sites with negative rheumatoid factor ([place]): She has left second digit flexor tenosynovitis.  She has tenderness  and synovitis of the left second MCP and second PIP joint.  She requested a cortisone injection today in the office.  She tolerated the procedure well.  Procedure note as listed above.  She continues to inject methotrexate 0.8 mL subcutaneously once weekly takes folic acid 2 mg daily, and sulfasalazine 500 mg 1 tablet twice daily.  She was cleared to start on Humira by Dr. Jana Hakim.  TB gold will be ordered today.  She will return for nurse visit for her first injection of Humira.  She was advised to finish her prednisone taperand continue on her current treatment regimen.    High risk medication use - Methotrexate subcutaneous0.8 ml sq q wk, sulfasalazine 500 mg , 2 tabs po bid, folic acid 2mg  po qd. TB gold was ordered today.  She has had all the other necessary labs before starting on Humira.  Primary osteoarthritis of both hands: She has PIP and DIP synovial thickening consistent with zoster arthritis of bilateral hands.  Joint protection muscle strengthening were discussed.  Primary osteoarthritis of both knees: No warmth or effusion.  She is good range of motion no discomfort at this time.  Malignant neoplasm of upper-outer quadrant of left breast in female, estrogen receptor positive [facility]) - September 2016, partial mastectomy, radiation therapy for 1 month, on tamoxifen followed up by Dr. Jana Hakim.  She was cleared by Dr. Jana Hakim to restart on Humira.  History of immunosuppressive therapy -TB gold was checked today.  Plan: QuantiFERON-TB Gold Plus   Orders: Orders Placed This Encounter  Procedures  . QuantiFERON-TB Gold Plus   No orders of the defined types were placed in this encounter.   Follow-Up Instructions: Return in about 3 months (around 12/11/2018) for Rheumatoid arthritis, Osteoarthritis.   Ofilia Neas, PA-C  Note - This record has been created using Dragon software.  Chart creation errors have been sought, but may not always  have been located. Such creation errors do  not reflect on  the standard of medical care.

## 2018-09-09 NOTE — Telephone Encounter (Signed)
Called patient to schedule appointment to restart Humira.  Left voicemail.

## 2018-09-10 ENCOUNTER — Other Ambulatory Visit: Payer: Self-pay | Admitting: Rheumatology

## 2018-09-10 ENCOUNTER — Ambulatory Visit: Payer: BLUE CROSS/BLUE SHIELD | Admitting: Physician Assistant

## 2018-09-10 ENCOUNTER — Encounter: Payer: Self-pay | Admitting: Physician Assistant

## 2018-09-10 VITALS — BP 115/76 | HR 79 | Resp 13 | Ht 70.0 in | Wt 236.0 lb

## 2018-09-10 DIAGNOSIS — M0609 Rheumatoid arthritis without rheumatoid factor, multiple sites: Secondary | ICD-10-CM

## 2018-09-10 DIAGNOSIS — C50412 Malignant neoplasm of upper-outer quadrant of left female breast: Secondary | ICD-10-CM

## 2018-09-10 DIAGNOSIS — M170 Bilateral primary osteoarthritis of knee: Secondary | ICD-10-CM

## 2018-09-10 DIAGNOSIS — Z79899 Other long term (current) drug therapy: Secondary | ICD-10-CM | POA: Diagnosis not present

## 2018-09-10 DIAGNOSIS — Z9225 Personal history of immunosupression therapy: Secondary | ICD-10-CM

## 2018-09-10 DIAGNOSIS — M19041 Primary osteoarthritis, right hand: Secondary | ICD-10-CM | POA: Diagnosis not present

## 2018-09-10 DIAGNOSIS — M65322 Trigger finger, left index finger: Secondary | ICD-10-CM

## 2018-09-10 DIAGNOSIS — Z170 Estrogen receptor positive status [ER+]: Secondary | ICD-10-CM

## 2018-09-10 DIAGNOSIS — M19042 Primary osteoarthritis, left hand: Secondary | ICD-10-CM

## 2018-09-10 MED ORDER — LIDOCAINE HCL 1 % IJ SOLN
0.5000 mL | INTRAMUSCULAR | Status: AC | PRN
  Administered 2018-09-10: .5 mL

## 2018-09-10 MED ORDER — TRIAMCINOLONE ACETONIDE 40 MG/ML IJ SUSP
10.0000 mg | INTRAMUSCULAR | Status: AC | PRN
  Administered 2018-09-10: 10 mg

## 2018-09-10 NOTE — Telephone Encounter (Signed)
Received a fax from [facility] regarding a prior authorization for Humira. Authorization has been APPROVED from 09/08/18 to 12/09/2038.   Will send document to scan center.  Authorization # [id number] Phone # [phone]  8:08 AM Beatriz Chancellor, CPhT

## 2018-09-10 NOTE — Telephone Encounter (Signed)
Patient will be unable to start her Humira at appointment on 09/10/18 as she needs updated TB Gold. Last TB Gold 07/12/2015. Will have patient update at her appointment today. Once we have results. Patient can schedule appointment to start Humira.

## 2018-09-10 NOTE — Telephone Encounter (Signed)
Humira has been approved. Patient has appointment today at 2:20pm.

## 2018-09-10 NOTE — Telephone Encounter (Signed)
Last Visit: 05/26/18 Next Visit: 10/14/18 Labs: 08/14/18 LFTs are mildly elevated.  Okay to refill per Dr. Estanislado Pandy

## 2018-09-12 ENCOUNTER — Encounter: Payer: Self-pay | Admitting: *Deleted

## 2018-09-12 LAB — QUANTIFERON-TB GOLD PLUS
MITOGEN-NIL: 8.96 [IU]/mL
NIL: 0.01 IU/mL
QuantiFERON-TB Gold Plus: NEGATIVE
TB1-NIL: 0 [IU]/mL
TB2-NIL: 0 IU/mL

## 2018-09-12 NOTE — Progress Notes (Signed)
TB gold negative

## 2018-09-16 ENCOUNTER — Telehealth: Payer: Self-pay | Admitting: Rheumatology

## 2018-09-16 MED ORDER — ADALIMUMAB 40 MG/0.4ML ~~LOC~~ AJKT: 40.0000 mg | AUTO-INJECTOR | SUBCUTANEOUS | 0 refills | Status: AC

## 2018-09-16 NOTE — Telephone Encounter (Signed)
Per plan, patient must use [facility], patient's deductible has been met and she should be covered 100%. Patient is eligible to use a copay card if needed. Please send in new prescription.  9:43 AM Beatriz Chancellor, CPhT

## 2018-09-16 NOTE — Telephone Encounter (Signed)
Patient called stating she has a few questions to ask you before her appointment tomorrow at 10:00.  Patient requested a return call.

## 2018-09-16 NOTE — Progress Notes (Deleted)
Pharmacy Note  Patient presents today to the [facility] to see Dr. Estanislado Pandy.   Patient seen by the pharmacist for counseling on Humira pen.  Current regimen includes  methotrexate 0.8 mL subcutaneously once weekly, folic acid 2 mg daily, and sulfasalazine 500 mg 1 tablet twice daily which will be continued along with initiation of Humira.  Previously on Humira but was discontinued after the diagnosis of breast cancer.  Dr. Jana Hakim cleared her to restart Humira.  Objective: TB Test: negative 09/10/18 Hepatitis panel: negative 06/30/2013 HIV: negative 06/30/2013 SPEP: negative 06/30/2013 Immunoglobulins:  negative 06/30/2013  Chest x-ray: no active cardiopulmonary disease 06/30/2013  CBC    Component Value Date/Time   WBC 6.4 08/14/2018 1101   RBC 3.59 (L) 08/14/2018 1101   HGB 12.0 08/14/2018 1101   HGB 13.5 08/16/2015 1620   HCT 35.5 08/14/2018 1101   HCT 39.9 08/16/2015 1620   PLT 297 08/14/2018 1101   PLT 272 08/16/2015 1620   MCV 98.9 08/14/2018 1101   MCV 97.3 08/16/2015 1620   MCH 33.4 (H) 08/14/2018 1101   MCHC 33.8 08/14/2018 1101   RDW 12.7 08/14/2018 1101   RDW 13.1 08/16/2015 1620   LYMPHSABS 1,581 08/14/2018 1101   LYMPHSABS 3.7 (H) 08/16/2015 1620   MONOABS 486 05/10/2017 1147   MONOABS 0.8 08/16/2015 1620   EOSABS 224 08/14/2018 1101   EOSABS 0.3 08/16/2015 1620   BASOSABS 83 08/14/2018 1101   BASOSABS 0.1 08/16/2015 1620    Does patient have diagnosis of heart failure?  {yes/no:20286}  Assessment/Plan:  Counseled patient that Humira is a TNF blocking agent.  Reviewed Humira dose of 40 mg every other week.  Counseled patient on purpose, proper use, and adverse effects of Humira.  Reviewed the most common adverse effects including infections, headache, and injection site reactions. Discussed that there is the possibility of an increased risk of malignancy but it is not well understood if this increased risk is due to the medication or the disease  state.  Advised patient to get yearly dermatology exams due to risk of skin cancer.  Reviewed the importance of regular labs while on Humira therapy.  Advised patient to get standing labs one month after starting Humira then every 3 months.  Provided patient with standing lab orders.  Counseled patient that Humira should be held prior to scheduled surgery.  Counseled patient to avoid live vaccines while on Humira.  Advised patient to get annual influenza vaccine and the pneumococcal vaccine as needed.  Provided patient with medication education material and answered all questions.  Patient voiced understanding.  Patient consented to Humira.  Will upload consent into the media tab.  Reviewed storage instructions of Humira.  Advised initial injection must be administered in office.    Patient voiced understanding.    Demonstrated proper injection technique with **** demo pen.  Patient able to demonstrate proper injection technique using the teach back method.  Patient self injected in the **** with:  Sample Medication: *** NDC: *** Lot: *** Expiration: **  Patient tolerated well.  Observed for 30 mins in office for adverse reaction and ***. Instructed patient to call with any questions/issues.

## 2018-09-16 NOTE — Addendum Note (Signed)
Addended by: Mariella Saa C on: 09/16/2018 10:06 AM   Modules accepted: Orders

## 2018-09-16 NOTE — Telephone Encounter (Signed)
New Rx sent to [facility].  Patient has New Start visit tomorrow and will use samples.

## 2018-09-17 ENCOUNTER — Ambulatory Visit: Payer: Self-pay

## 2018-09-17 MED ORDER — PREDNISONE 5 MG PO TABS: ORAL_TABLET | ORAL | 0 refills | Status: AC

## 2018-09-17 NOTE — Addendum Note (Signed)
Addended by: Carole Binning on: 09/17/2018 01:48 PM   Modules accepted: Orders

## 2018-09-17 NOTE — Telephone Encounter (Signed)
Prescription sent to pharmacy and left message to advise patient.

## 2018-09-17 NOTE — Telephone Encounter (Signed)
Okay to refill prednisone.

## 2018-09-30 NOTE — Progress Notes (Deleted)
Office Visit Note  Patient: Kim Porter             Date of Birth: 1966-11-29           MRN: [id number]             PCP: Jefm Petty, MD Referring: Jefm Petty, MD Visit Date: 10/14/2018 Occupation: @GUAROCC @  Subjective:  No chief complaint on file.   History of Present Illness: Kim Porter is a 52 y.o. female ***   Activities of Daily Living:  Patient reports morning stiffness for *** {minute/hour:19697}.   Patient {ACTIONS;DENIES/REPORTS:21021675::"Denies"} nocturnal pain.  Difficulty dressing/grooming: {ACTIONS;DENIES/REPORTS:21021675::"Denies"} Difficulty climbing stairs: {ACTIONS;DENIES/REPORTS:21021675::"Denies"} Difficulty getting out of chair: {ACTIONS;DENIES/REPORTS:21021675::"Denies"} Difficulty using hands for taps, buttons, cutlery, and/or writing: {ACTIONS;DENIES/REPORTS:21021675::"Denies"}  No Rheumatology ROS completed.   PMFS History:  Patient Active Problem List   Diagnosis Date Noted  . Effusion, left knee 11/23/2016  . High risk medication use 11/23/2016  . Trigger ring finger of right hand 11/23/2016  . History of right Achilles tendinitis 11/23/2016  . Primary osteoarthritis of both knees 11/23/2016  . Primary osteoarthritis of both hands 11/23/2016  . Genetic testing 09/06/2015  . Family history of breast cancer in mother 08/25/2015  . Family history of breast cancer in female 08/25/2015  . Rheumatoid arthritis ([place]) 08/16/2015  . Malignant neoplasm of upper-outer quadrant of left breast in female, estrogen receptor positive ([place]) 08/12/2015    Past Medical History:  Diagnosis Date  . Breast cancer of upper-outer quadrant of left female breast ([place]) 08/12/2015  . Personal history of radiation therapy 2016  . Radiation 10/18/15-11/11/15   left breast 42.5 Gy, left breast boost 7.5 Gy  . Rheumatoid arthritis ([place])     Family History  Problem Relation Age of Onset  . Breast cancer Mother 39  . Colon polyps Mother 32  . Other Sister     has had "7 lumpectomies, all benign"  . Diabetes Sister   . Cancer Maternal Grandmother        unknown primary - metastasized  . Emphysema Maternal Grandfather   . Cancer Maternal Uncle 61       unspecified type; "caused by agent orange"  . Breast cancer Other        (x2); dx. 35 and 75  . Breast cancer Cousin 66       multiple primaries; negative genetic testing   . Alzheimer's disease Other    Past Surgical History:  Procedure Laterality Date  . BREAST LUMPECTOMY Left 2016  . BREAST LUMPECTOMY WITH RADIOACTIVE SEED LOCALIZATION Left 09/02/2015   Procedure: LEFT BREAST LUMPECTOMY WITH RADIOACTIVE SEED LOCALIZATION;  Surgeon: Fanny Skates, MD;  Location: [place];  Service: General;  Laterality: Left;  . EYE SURGERY    . Lasix surgery bilateral      Social History   Social History Narrative  . Not on file    Objective: Vital Signs: There were no vitals taken for this visit.   Physical Exam   Musculoskeletal Exam: ***  CDAI Exam: CDAI Score: Not documented Patient Global Assessment: Not documented; Provider Global Assessment: Not documented Swollen: Not documented; Tender: Not documented Joint Exam   Not documented   There is currently no information documented on the homunculus. Go to the Rheumatology activity and complete the homunculus joint exam.  Investigation: No additional findings.  Imaging: No results found.  Recent Labs: Lab Results  Component Value Date   WBC 6.4 08/14/2018   HGB 12.0 08/14/2018   PLT  297 08/14/2018   NA 138 08/14/2018   K 4.8 08/14/2018   CL 104 08/14/2018   CO2 25 08/14/2018   GLUCOSE 89 08/14/2018   BUN 10 08/14/2018   CREATININE 0.75 08/14/2018   BILITOT 0.4 08/14/2018   ALKPHOS 56 05/10/2017   AST 39 (H) 08/14/2018   ALT 35 (H) 08/14/2018   PROT 7.0 08/14/2018   ALBUMIN 4.2 05/10/2017   CALCIUM 9.3 08/14/2018   GFRAA 107 08/14/2018   QFTBGOLDPLUS NEGATIVE 09/10/2018    Speciality Comments: No  specialty comments available.  Procedures:  No procedures performed Allergies: Patient has no known allergies.   Assessment / Plan:     Visit Diagnoses: Rheumatoid arthritis of multiple sites with negative rheumatoid factor ([facility]) - left second digit flexor tenosynovitis  High risk medication use - Methotrexate subcutaneous0.8 ml sq q wk, sulfasalazine 500 mg , 2 tabs po bid, folic acid 2mg  po qd  Primary osteoarthritis of both hands  Primary osteoarthritis of both knees  Malignant neoplasm of upper-outer quadrant of left breast in female, estrogen receptor positive [facility]) -  September 2016, partial mastectomy, radiation therapy for 1 month, on tamoxifen followed up by Dr. Jana Hakim.  She was cleared by Dr. Jana Hakim to restart Humira  History of immunosuppressive therapy  Trigger index finger of left hand  Trigger ring finger of right hand  History of right Achilles tendinitis   Orders: No orders of the defined types were placed in this encounter.  No orders of the defined types were placed in this encounter.   Face-to-face time spent with patient was *** minutes. Greater than 50% of time was spent in counseling and coordination of care.  Follow-Up Instructions: No follow-ups on file.   Ofilia Neas, PA-C  Note - This record has been created using Dragon software.  Chart creation errors have been sought, but may not always  have been located. Such creation errors do not reflect on  the standard of medical care.

## 2018-10-06 NOTE — Telephone Encounter (Signed)
Left message to see if she has decided to start Humira. Can schedule nurse visit for when she returns from vacation.  9:17 AM Beatriz Chancellor, CPhT

## 2018-10-14 ENCOUNTER — Ambulatory Visit: Payer: BLUE CROSS/BLUE SHIELD | Admitting: Physician Assistant

## 2018-12-08 ENCOUNTER — Other Ambulatory Visit: Payer: Self-pay

## 2018-12-08 DIAGNOSIS — Z79899 Other long term (current) drug therapy: Secondary | ICD-10-CM

## 2018-12-08 LAB — COMPLETE METABOLIC PANEL WITH GFR
AG RATIO: 1.5 (calc) (ref 1.0–2.5)
ALT: 23 U/L (ref 6–29)
AST: 19 U/L (ref 10–35)
Albumin: 4.1 g/dL (ref 3.6–5.1)
Alkaline phosphatase (APISO): 57 U/L (ref 33–130)
BUN: 15 mg/dL (ref 7–25)
CO2: 25 mmol/L (ref 20–32)
Calcium: 9.5 mg/dL (ref 8.6–10.4)
Chloride: 107 mmol/L (ref 98–110)
Creat: 0.77 mg/dL (ref 0.50–1.05)
GFR, EST NON AFRICAN AMERICAN: 89 mL/min/{1.73_m2} (ref >=60)
GFR, Est African American: 104 mL/min/{1.73_m2} (ref >=60)
Globulin: 2.7 g/dL (calc) (ref 1.9–3.7)
Glucose, Bld: 112 mg/dL — ABNORMAL HIGH (ref 65–99)
POTASSIUM: 4.8 mmol/L (ref 3.5–5.3)
Sodium: 139 mmol/L (ref 135–146)
Total Bilirubin: 0.3 mg/dL (ref 0.2–1.2)
Total Protein: 6.8 g/dL (ref 6.1–8.1)

## 2018-12-08 LAB — CBC WITH DIFFERENTIAL/PLATELET
Absolute Monocytes: 524 cells/uL (ref 200–950)
BASOS PCT: 1 %
Basophils Absolute: 77 cells/uL (ref 0–200)
Eosinophils Absolute: 485 cells/uL (ref 15–500)
Eosinophils Relative: 6.3 %
HCT: 38.4 % (ref 35.0–45.0)
HEMOGLOBIN: 12.5 g/dL (ref 11.7–15.5)
Lymphs Abs: 1802 cells/uL (ref 850–3900)
MCH: 32.2 pg (ref 27.0–33.0)
MCHC: 32.6 g/dL (ref 32.0–36.0)
MCV: 99 fL (ref 80.0–100.0)
MONOS PCT: 6.8 %
MPV: 12.3 fL (ref 7.5–12.5)
Neutro Abs: 4813 cells/uL (ref 1500–7800)
Neutrophils Relative %: 62.5 %
Platelets: 292 10*3/uL (ref 140–400)
RBC: 3.88 10*6/uL (ref 3.80–5.10)
RDW: 12.6 % (ref 11.0–15.0)
Total Lymphocyte: 23.4 %
WBC: 7.7 10*3/uL (ref 3.8–10.8)

## 2019-03-01 ENCOUNTER — Other Ambulatory Visit: Payer: Self-pay | Admitting: Rheumatology

## 2019-03-02 ENCOUNTER — Other Ambulatory Visit: Payer: Self-pay | Admitting: Rheumatology

## 2019-03-02 NOTE — Telephone Encounter (Signed)
Last Visit: 09/10/2018 Next Visit: message sent to the front desk to schedule.  Labs: 12/08/2018 Glucose is 112. All other labs are WNL.  Okay to refill per Dr. Estanislado Pandy.

## 2019-03-02 NOTE — Telephone Encounter (Signed)
Last Visit: 09/10/2018 Next Visit: message sent to the front desk to schedule.    Okay to refill per Dr. Estanislado Pandy.

## 2019-03-04 ENCOUNTER — Telehealth: Payer: Self-pay | Admitting: Rheumatology

## 2019-03-04 NOTE — Telephone Encounter (Signed)
Please see below and advise. Thank you

## 2019-03-04 NOTE — Telephone Encounter (Signed)
Patient called stating Dr. Estanislado Pandy is not in network with her new insurance [employer]).  Patient requested a return call to let her know if Dr. Estanislado Pandy could refer her to a Rheumatologist in [facility] that is in the [facility] system.  Patient states she would be willing to stay with Dr. Estanislado Pandy if a discount price could be offered.

## 2019-03-04 NOTE — Telephone Encounter (Signed)
Can you please advise?

## 2019-03-26 ENCOUNTER — Other Ambulatory Visit: Payer: Self-pay | Admitting: Rheumatology

## 2019-03-26 NOTE — Telephone Encounter (Signed)
Last Visit: 09/10/2018 Next Visit: message sent to the front desk to schedule.  Labs: 12/08/18 Glucose is 112. All other labs are WNL.  Okay to refill MTX?

## 2019-03-26 NOTE — Telephone Encounter (Signed)
ok 

## 2019-04-29 ENCOUNTER — Other Ambulatory Visit: Payer: Self-pay | Admitting: Rheumatology

## 2019-04-29 NOTE — Telephone Encounter (Signed)
Okay to give 30-day supply.  Please notify patient that we cannot give any more refills and it is high risk for her to take medication without monitoring labs.

## 2019-04-29 NOTE — Telephone Encounter (Signed)
Last Visit:09/10/2018 Next Visit:was due in January 2020 Labs: 12/08/18 Glucose is 112. All other labs are WNL.  Attempted to contact patient and left message to advise patient she is due for labs and a follow up appointment.   Okay to refill MTX?

## 2019-05-21 IMAGING — MG 2D DIGITAL DIAGNOSTIC BILATERAL MAMMOGRAM WITH CAD AND ADJUNCT T
8 of 14 series · 8 of 30 positions shown · non-contrast
Comparison: Previous exam(s).

CLINICAL DATA: Status post left lumpectomy for breast carcinoma
performed in 1265 with adjuvant radiation therapy.No current
complaints.

EXAM:
2D DIGITAL DIAGNOSTIC BILATERAL MAMMOGRAM WITH CAD AND ADJUNCT TOMO

[L XCCL]
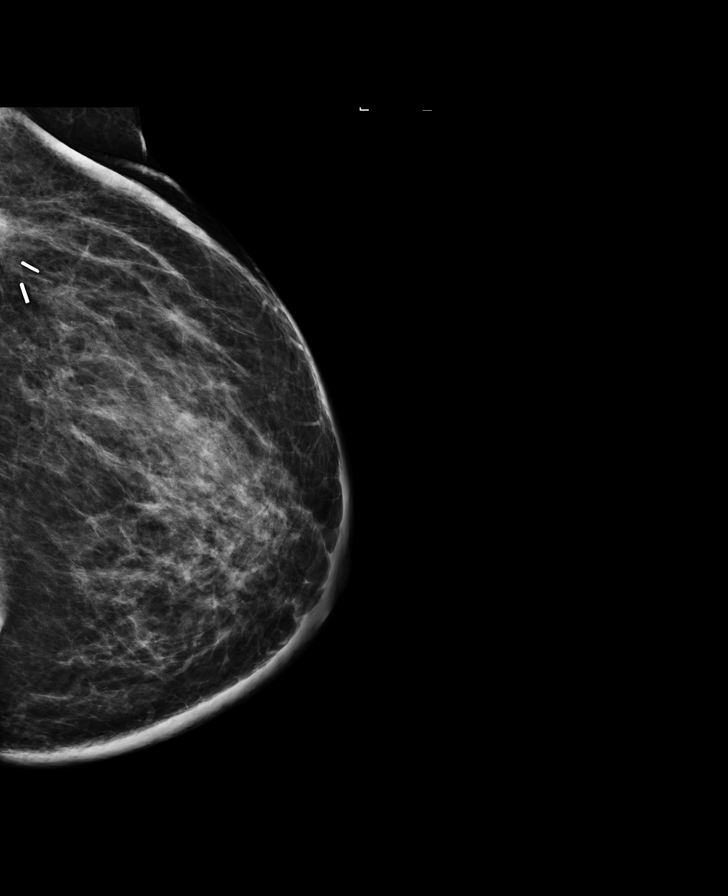

[L MLO (1 of 2)]
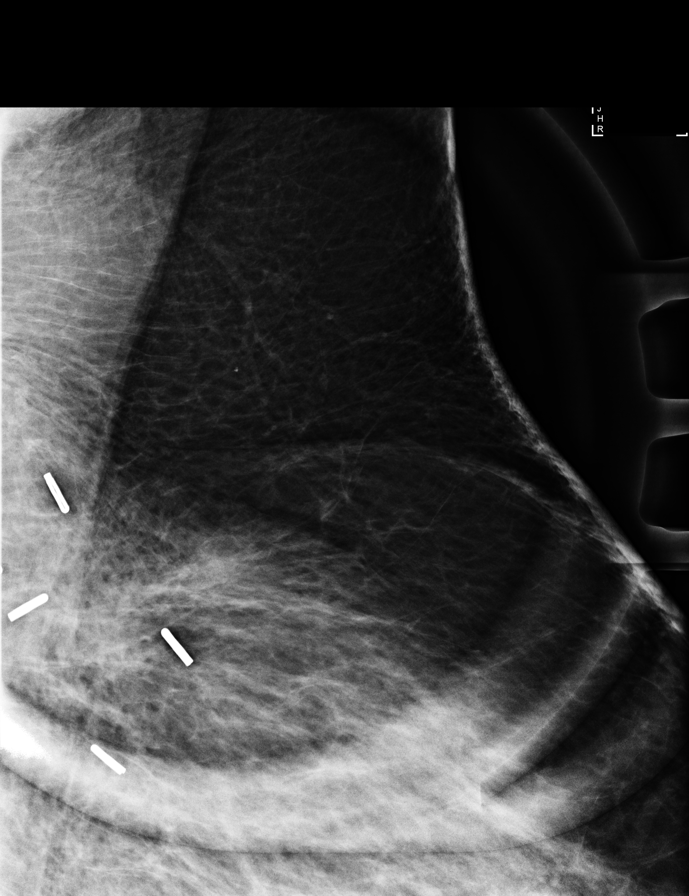

[R MLO synth-2D]
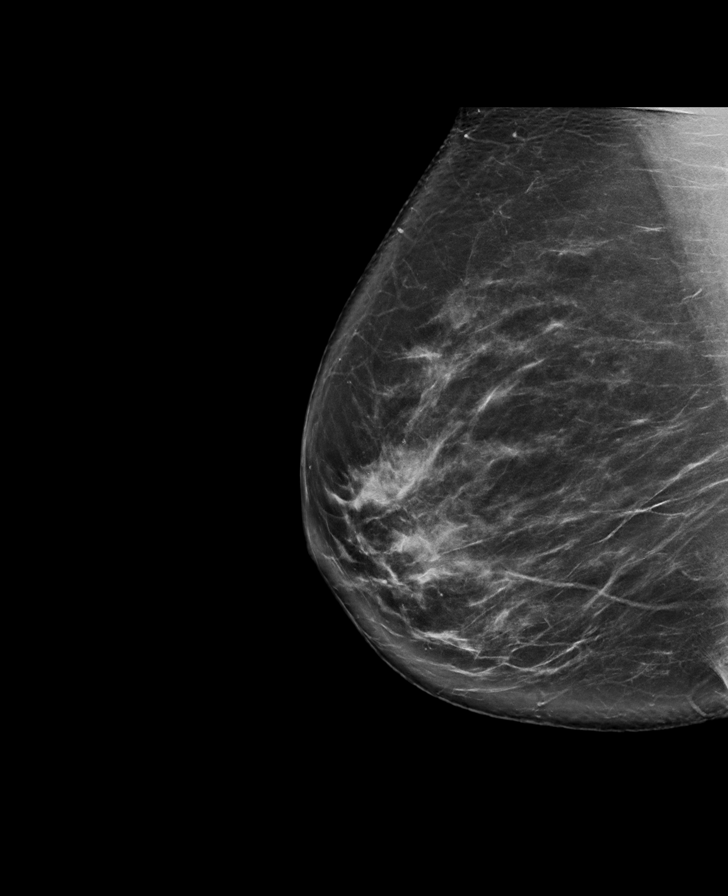

[R CC synth-2D]
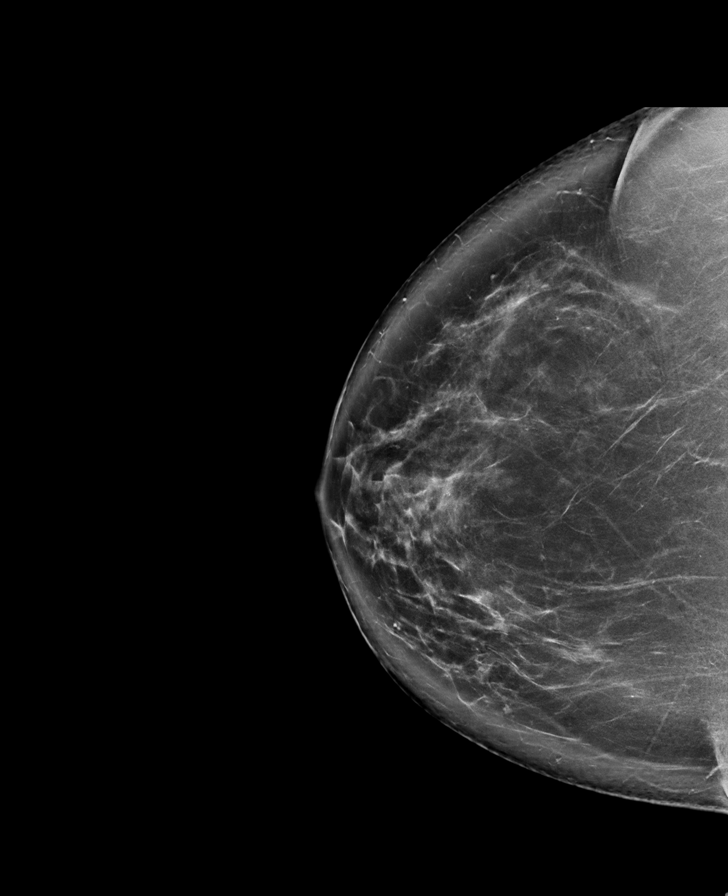

[R CC]
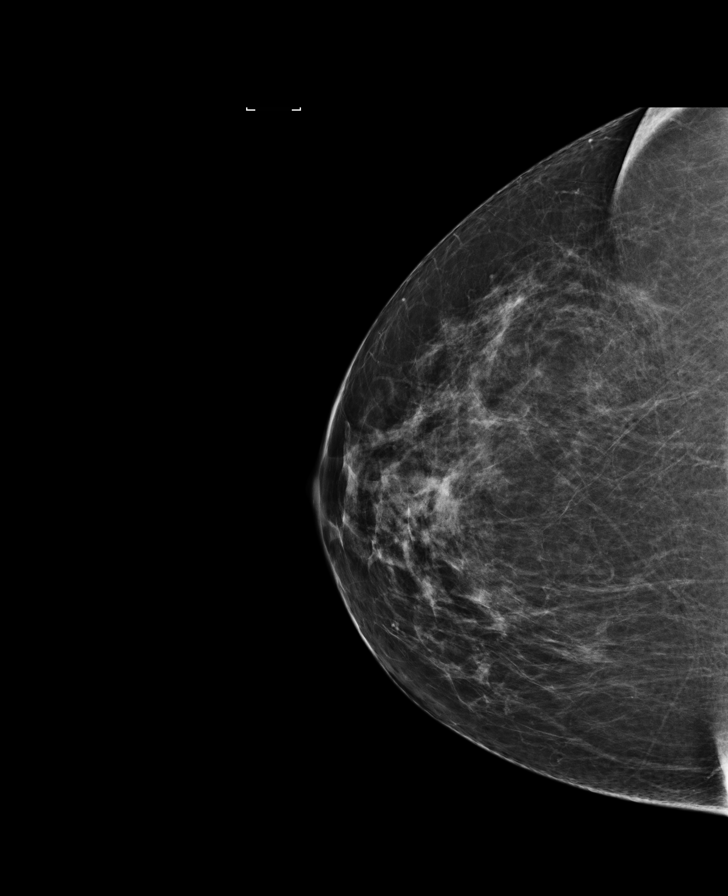

[L MLO (2 of 2)]
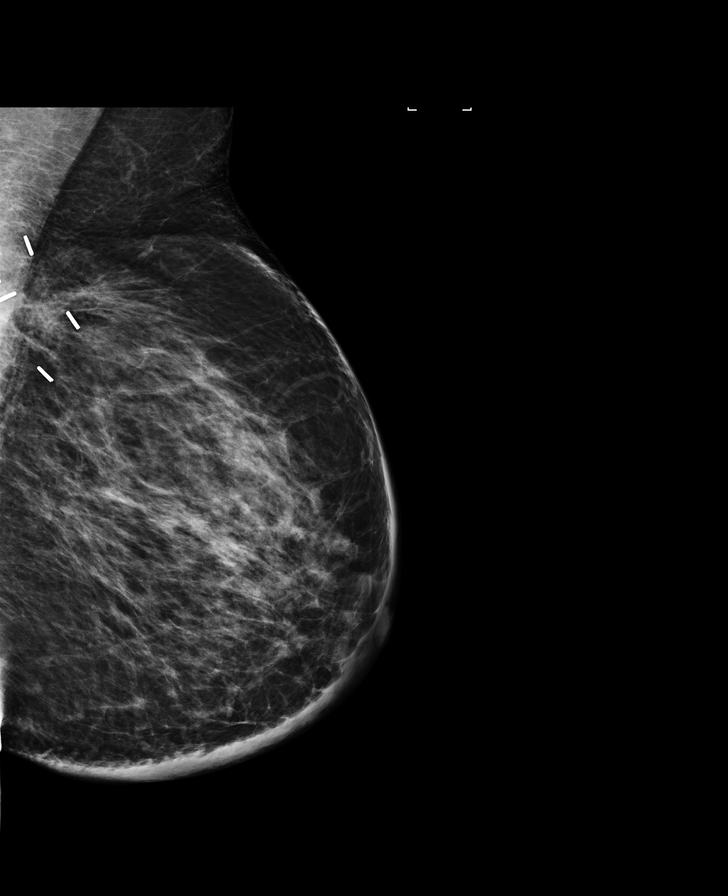

[R MLO]
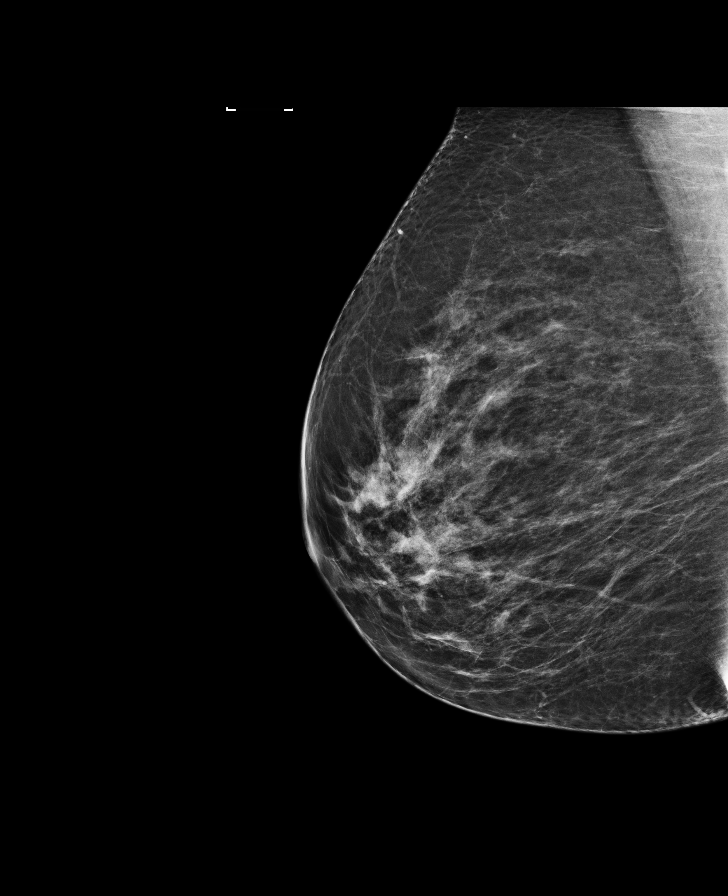

[L CC]
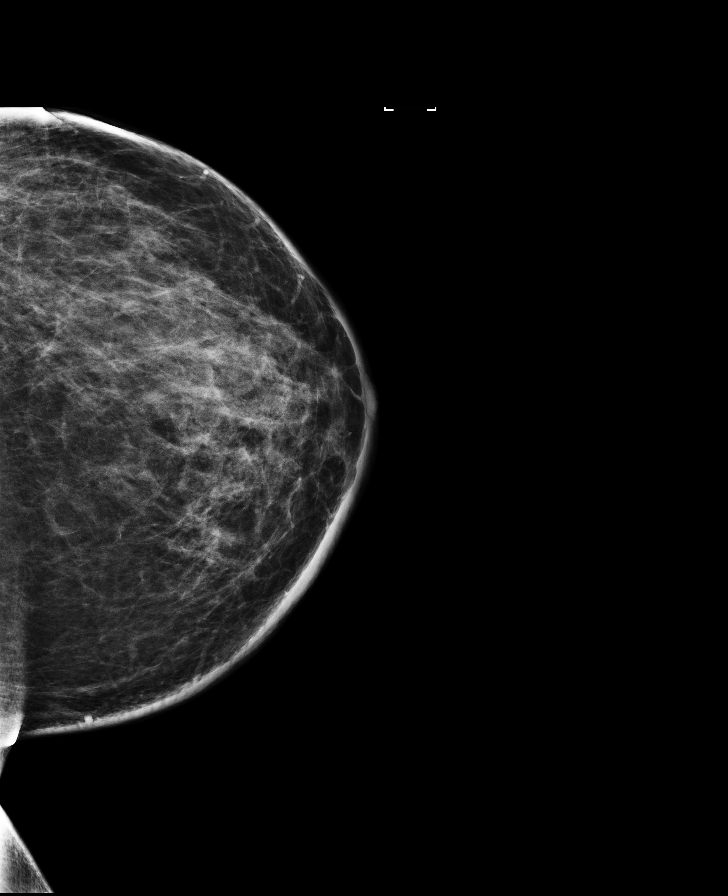

[8 of 30 positions shown; findings below may reference images not displayed]

ACR Breast Density Category c: The breast tissue is heterogeneously
dense, which may obscure small masses.
FINDINGS: There are no discrete masses. There is architectural distortion
reflecting postsurgical scarring in the posterior upper outer left
breast. No other architectural distortion. There are no suspicious
calcifications.

Mammographic images were processed with CAD.
IMPRESSION: 1. No evidence of new or recurrent breast carcinoma.
2. Benign postsurgical changes on the left.

RECOMMENDATION:
Diagnostic mammography in 1 year per standard post lumpectomy
protocol.

I have discussed the findings and recommendations with the patient.
Results were also provided in writing at the conclusion of the
visit. If applicable, a reminder letter will be sent to the patient
regarding the next appointment.

BI-RADS CATEGORY  2: Benign.

## 2019-07-07 ENCOUNTER — Other Ambulatory Visit: Payer: Self-pay | Admitting: Obstetrics & Gynecology

## 2019-07-07 ENCOUNTER — Other Ambulatory Visit: Payer: Self-pay | Admitting: Oncology

## 2019-07-07 DIAGNOSIS — Z9889 Other specified postprocedural states: Secondary | ICD-10-CM

## 2019-08-03 ENCOUNTER — Other Ambulatory Visit: Payer: Self-pay

## 2019-08-03 ENCOUNTER — Ambulatory Visit
Admission: RE | Admit: 2019-08-03 | Discharge: 2019-08-03 | Disposition: A | Discharge status: Home / Self Care | Payer: BLUE CROSS/BLUE SHIELD | Source: Ambulatory Visit | Attending: Obstetrics & Gynecology | Admitting: Obstetrics & Gynecology

## 2019-08-03 DIAGNOSIS — Z9889 Other specified postprocedural states: Secondary | ICD-10-CM

## 2019-08-10 NOTE — Progress Notes (Signed)
Kim Porter  [phone] Fax:([phone]) [phone]     ID: Kim Porter DOB: 02-12-1966  MR#: [id number]  XTK#:[id number]  Patient Care Team: Jefm Petty, MD as PCP - General (Family Medicine) Vania Rea, MD as Consulting Physician (Obstetrics and Gynecology) Kierstynn Babich, Virgie Dad, MD as Consulting Physician (Oncology) Fanny Skates, MD as Consulting Physician (General Surgery) Sylvan Cheese, NP as Nurse Practitioner (Hematology and Oncology) Thea Silversmith, MD as Consulting Physician (Radiation Oncology) PCP: Jefm Petty, MD OTHER MD: Dyke Brackett MD, Nena Polio MD, Thea Silversmith M.D.   CHIEF COMPLAINT: ductal carcinoma in situ  CURRENT TREATMENT: Observation   BREAST CANCER HISTORY: From the original intake note:  Sarenity had bilateral screening mammography with tomography 07/11/2015 showing calcifications in the left breast, requiring further imaging.on 07/15/2015 she underwent a diagnostic left mammography which showed the breast density to be category C. 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  Her subsequent history is as detailed below.   INTERVAL HISTORY: Kim Porter returns today for follow-up and treatment of her estrogen receptor positive ductal carcinoma in situ. She was last seen here on 07/01/2018.   She continues under observation.  She has not noted any change in either breast.  Since her last visit here, she underwent a digital diagnostic bilateral mammogram with tomography on 08/03/2019 showing: Breast Density Category C. 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  She shares an agency with Kim Porter, her mother-in-law.  Quiera exercises chiefly by riding her horses about 30 minutes at a time, about 4 days a week.  She also does some walking of course.  Currently she is working mostly out of the home and they are taking appropriate pandemic precautions.  A detailed review of systems today was otherwise stable.     PAST MEDICAL HISTORY: Past Medical History:  Diagnosis Date  . Breast cancer of upper-outer quadrant of left female breast ([place]) 08/12/2015  . Personal history of radiation therapy 2016  . Radiation 10/18/15-11/11/15   left breast 42.5 Gy, left breast boost 7.5 Gy  . Rheumatoid arthritis ([place])     PAST SURGICAL HISTORY: Past Surgical History:  Procedure Laterality Date  . BREAST LUMPECTOMY Left 2016  . BREAST LUMPECTOMY WITH RADIOACTIVE SEED LOCALIZATION Left 09/02/2015   Procedure: LEFT BREAST LUMPECTOMY WITH RADIOACTIVE SEED LOCALIZATION;  Surgeon: Fanny Skates, MD;  Location: [facility];  Service: General;  Laterality: Left;  . EYE SURGERY    . Lasix surgery bilateral       FAMILY HISTORY Family History  Problem Relation Age of Onset  . Breast cancer Mother 68  . Colon polyps Mother 66  . Other Sister        has had "7 lumpectomies, all benign"  . Diabetes Sister   . Cancer Maternal Grandmother        unknown primary - metastasized  . Emphysema Maternal Grandfather   . Cancer Maternal Uncle 61       unspecified type; "caused by agent orange"  . Breast cancer Other        (x2); dx. 42 and 75  . Breast cancer Cousin 61  multiple primaries; negative genetic testing   . Alzheimer's disease Other     the patient's parents are still living, in their late 38s. The patient's mother was diagnosed with breast cancer the age of 46.  She subsequently developed uterine cancer.  The maternal grandmother may have had breast cancer diagnosed at age 19. 26 of the patient's mother's maternal aunts were  diagnosed with breast cancer at the ages of 55 and 28. The patient also has a second cousin diagnosed with breast cancer at age 11. This cousin has been tested for a mutation  ( extensive panel) and no mutation was found  GYNECOLOGIC HISTORY:  No LMP recorded. (Menstrual status: Irregular Periods).  Menarche age 68 , the patient is GX P0. She is still having regular periods. 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 Her husband, Kim Porter, is a Customer service manager. They have land and keep approximately 75 horses, many of them theirs or otherwise boarded. They also have 3 dogs and one cat.    ADVANCED DIRECTIVES:  Not in place   HEALTH MAINTENANCE: Social History   Tobacco Use  . Smoking status: Former Smoker    Types: Cigarettes  . Smokeless tobacco: Never Used  . Tobacco comment: 1 ppwk for 4 yrs in college  Substance Use Topics  . Alcohol use: Yes    Alcohol/week: 1.0 - 2.0 standard drinks    Types: 1 - 2 Cans of beer per week  . Drug use: No     Colonoscopy:  PAP:  Bone density:  Lipid panel:  No Known Allergies  Current Outpatient Medications  Medication Sig Dispense Refill  . Adalimumab (HUMIRA PEN) 40 MG/0.4ML PNKT Inject 40 mg into the skin every 14 (fourteen) days. 2 each 0  . folic acid (FOLVITE) 1 MG tablet TAKE 2 TABLETS (2 MG TOTAL) BY MOUTH DAILY. 180 tablet 3  . methotrexate 50 MG/2ML injection INJECT 0.8 MLS (20 MG TOTAL) INTO THE SKIN ONCE A WEEK. 4 mL 0  . Omega-3 Fatty Acids (FISH OIL PO) Take 1,200 mg by mouth daily.    . predniSONE (DELTASONE) 5 MG tablet Take 4 tabs po x 4 days, 3  tabs po x 4 days, 2  tabs po x 4 days, 1  tab po x 4 days 40 tablet 0  . sulfaSALAzine (AZULFIDINE) 500 MG tablet TAKE 2 TABLETS (1,000 MG TOTAL) BY MOUTH 2 (TWO) TIMES DAILY. (Patient taking differently: Take 500 mg by mouth 2 (two) times daily. ) 360 tablet 0  . Tuberculin-Allergy Syringes (B-D  ALLERGY SYRINGE 1CC/28G) 28G X 1/2" 1 ML MISC USE 1 SYRINGE ONCE A WEEK 12 each 1   No current facility-administered medications for this visit.     OBJECTIVE:  Middle-aged white womanI in no acute distress  Vitals:   08/11/19 1323  BP: 133/77  Pulse: 65  Resp: 19  Temp: 98.5 F (36.9 C)  SpO2: 99%   Wt Readings from Last 3 Encounters:  08/11/19 220 lb 6.4 oz (100 kg)  09/10/18 236 lb (107 kg)  08/26/18 238 lb (108 kg)   Body mass index is 31.62 kg/m.    ECOG FS:1 - Symptomatic but completely ambulatory  Ocular: Sclerae unicteric, pupils round and equal Ear-nose-throat: Wearing a mask Lymphatic: No cervical or supraclavicular adenopathy Lungs no rales or rhonchi Heart regular rate and rhythm Abd soft, nontender, positive bowel sounds MSK no focal spinal  tenderness, no joint edema Neuro: non-focal, well-oriented, appropriate affect Breasts: The right breast is benign.  The left breast has undergone lumpectomy followed by radiation.  There is an area of skin thickening in the lower inner quadrant which is otherwise unremarkable.  There is no evidence of disease recurrence.  Both axillae are benign.  LAB RESULTS:  CMP     Component Value Date/Time   NA 139 12/08/2018 1134   NA 139 08/16/2015 1620   K 4.8 12/08/2018 1134   K 3.6 08/16/2015 1620   CL 107 12/08/2018 1134   CO2 25 12/08/2018 1134   CO2 23 08/16/2015 1620   GLUCOSE 112 (H) 12/08/2018 1134   GLUCOSE 104 08/16/2015 1620   BUN 15 12/08/2018 1134   BUN 11.2 08/16/2015 1620   CREATININE 0.77 12/08/2018 1134   CREATININE 0.8 08/16/2015 1620   CALCIUM 9.5 12/08/2018 1134   CALCIUM 9.5 08/16/2015 1620   PROT 6.8 12/08/2018 1134   PROT 7.5 08/16/2015 1620   ALBUMIN 4.2 05/10/2017 1108   ALBUMIN 4.2 08/16/2015 1620   AST 19 12/08/2018 1134   AST 18 08/16/2015 1620   ALT 23 12/08/2018 1134   ALT 25 08/16/2015 1620   ALKPHOS 56 05/10/2017 1108   ALKPHOS 53 08/16/2015 1620   BILITOT 0.3 12/08/2018 1134    BILITOT 0.30 08/16/2015 1620   GFRNONAA 89 12/08/2018 1134   GFRAA 104 12/08/2018 1134    INo results found for: SPEP, UPEP  Lab Results  Component Value Date   WBC 7.7 12/08/2018   NEUTROABS 4,813 12/08/2018   HGB 12.5 12/08/2018   HCT 38.4 12/08/2018   MCV 99.0 12/08/2018   PLT 292 12/08/2018      Chemistry      Component Value Date/Time   NA 139 12/08/2018 1134   NA 139 08/16/2015 1620   K 4.8 12/08/2018 1134   K 3.6 08/16/2015 1620   CL 107 12/08/2018 1134   CO2 25 12/08/2018 1134   CO2 23 08/16/2015 1620   BUN 15 12/08/2018 1134   BUN 11.2 08/16/2015 1620   CREATININE 0.77 12/08/2018 1134   CREATININE 0.8 08/16/2015 1620      Component Value Date/Time   CALCIUM 9.5 12/08/2018 1134   CALCIUM 9.5 08/16/2015 1620   ALKPHOS 56 05/10/2017 1108   ALKPHOS 53 08/16/2015 1620   AST 19 12/08/2018 1134   AST 18 08/16/2015 1620   ALT 23 12/08/2018 1134   ALT 25 08/16/2015 1620   BILITOT 0.3 12/08/2018 1134   BILITOT 0.30 08/16/2015 1620       No results found for: LABCA2  No components found for: POEUM353  No results for input(s): INR in the last 168 hours.  Urinalysis No results found for: COLORURINE, APPEARANCEUR, LABSPEC, PHURINE, GLUCOSEU, HGBUR, BILIRUBINUR, KETONESUR, PROTEINUR, UROBILINOGEN, NITRITE, LEUKOCYTESUR  STUDIES: Mm Diag Breast Tomo Bilateral  Result Date: 08/03/2019 CLINICAL DATA:  Left lumpectomy.  Annual mammography. EXAM: DIGITAL DIAGNOSTIC BILATERAL MAMMOGRAM WITH CAD AND TOMO COMPARISON:  Previous exam(s). ACR Breast Density Category c: The breast tissue is heterogeneously dense, which may obscure small masses. FINDINGS: The left lumpectomy site is stable. No suspicious masses, calcifications, or distortion in either breast. Mammographic images were processed with CAD. IMPRESSION: Stable left lumpectomy site. No mammographic evidence of malignancy on the left. RECOMMENDATION: Annual diagnostic mammography. I have discussed the findings and  recommendations with the patient. Results were also provided in writing at the conclusion of the visit. If applicable, a reminder letter will be  sent to the patient regarding the next appointment. BI-RADS CATEGORY  2: Benign. Electronically Signed   By: Dorise Bullion III M.D   On: 08/03/2019 15:11    ASSESSMENT: 53 y.o. [place] woman status post left breast upper outer quadrant biopsy 08/05/2015 for ductal carcinoma in situ, high-grade, estrogen and progesterone receptor positive   (1) genetics testing 09/05/2015 through the Breast/Ovarian Cancer Panel offered by [facility] found no deleterious mutations or variants of uncertain significance in ATM, BARD1, BRCA1, BRCA2, BRIP1, CDH1, CHEK2, FANCC, MLH1, MSH2, MSH6, NBN, PALB2, PMS2, PTEN, RAD51C, RAD51D, TP53, and XRCC2.  This panel also includes deletion/duplication analysis (without sequencing) for one gene, EPCAM.    (2) left lumpectomy 09/02/2015 showed residual high-grade ductal carcinoma in situ, with negative and ample margins  (3) adjuvant radiation 10/18/15-11/11/15 ;  Left breast/ 42.5 Gy at 2.5 Gy per fraction x 17 fractions.  Left breast boost/ 7.5 Gy at 2.5 Gy per fraction x 3 fractions   (4) tamoxifen started January 2017  (a) tamoxifen discontinued May 2019 when patient's mother developed uterine cancer while on tamoxifen    PLAN: Fabian is now 4 years out from definitive surgery for breast cancer with no evidence of disease recurrence.  This is very favorable.  She is taking appropriate pandemic precautions.  She is exercising regularly.  She is very concerned about her mother-in-law, Kim Porter situation.  I gave her a copy of her mammogram which still shows fairly dense breasts.  I encouraged her to continue breast self-awareness exams.  Destynie will return to see me in 1 year.  At that time likely she will "graduate".  She knows to call for any other issue that may develop before that visit.   Aunesti Pellegrino, Virgie Dad, MD   08/11/19 1:41 PM Medical Oncology and Hematology [facility] [address] [place], [place] [address] Tel. [phone]    Fax. [phone]  I, Jacqualyn Posey am acting as a Education administrator for Chauncey Cruel, MD.   I, Lurline Del MD, have reviewed the above documentation for accuracy and completeness, and I agree with the above.

## 2019-08-11 ENCOUNTER — Inpatient Hospital Stay: Payer: BLUE CROSS/BLUE SHIELD | Attending: Oncology | Admitting: Oncology

## 2019-08-11 ENCOUNTER — Other Ambulatory Visit: Payer: Self-pay

## 2019-08-11 VITALS — BP 133/77 | HR 65 | Temp 98.5°F | Resp 19 | Wt 220.4 lb

## 2019-08-11 DIAGNOSIS — Z170 Estrogen receptor positive status [ER+]: Secondary | ICD-10-CM | POA: Diagnosis not present

## 2019-08-11 DIAGNOSIS — M069 Rheumatoid arthritis, unspecified: Secondary | ICD-10-CM | POA: Insufficient documentation

## 2019-08-11 DIAGNOSIS — Z79899 Other long term (current) drug therapy: Secondary | ICD-10-CM | POA: Insufficient documentation

## 2019-08-11 DIAGNOSIS — Z87891 Personal history of nicotine dependence: Secondary | ICD-10-CM | POA: Insufficient documentation

## 2019-08-11 DIAGNOSIS — Z86000 Personal history of in-situ neoplasm of breast: Secondary | ICD-10-CM | POA: Diagnosis present

## 2019-08-11 DIAGNOSIS — C50412 Malignant neoplasm of upper-outer quadrant of left female breast: Secondary | ICD-10-CM | POA: Diagnosis not present

## 2019-08-11 DIAGNOSIS — Z803 Family history of malignant neoplasm of breast: Secondary | ICD-10-CM | POA: Insufficient documentation

## 2019-08-11 DIAGNOSIS — Z923 Personal history of irradiation: Secondary | ICD-10-CM | POA: Insufficient documentation

## 2019-08-12 ENCOUNTER — Telehealth: Payer: Self-pay | Admitting: Oncology

## 2019-08-12 NOTE — Telephone Encounter (Signed)
I talk with patient regarding schedule  

## 2019-09-16 ENCOUNTER — Other Ambulatory Visit: Payer: Self-pay

## 2019-09-16 DIAGNOSIS — Z79899 Other long term (current) drug therapy: Secondary | ICD-10-CM

## 2019-09-17 LAB — COMPLETE METABOLIC PANEL WITH GFR
AG Ratio: 1.6 (calc) (ref 1.0–2.5)
ALT: 15 U/L (ref 6–29)
AST: 14 U/L (ref 10–35)
Albumin: 4.3 g/dL (ref 3.6–5.1)
Alkaline phosphatase (APISO): 56 U/L (ref 37–153)
BUN: 15 mg/dL (ref 7–25)
CO2: 25 mmol/L (ref 20–32)
Calcium: 9.5 mg/dL (ref 8.6–10.4)
Chloride: 103 mmol/L (ref 98–110)
Creat: 0.71 mg/dL (ref 0.50–1.05)
GFR, Est African American: 113 mL/min/{1.73_m2} (ref >=60)
GFR, Est Non African American: 98 mL/min/{1.73_m2} (ref >=60)
Globulin: 2.7 g/dL (calc) (ref 1.9–3.7)
Glucose, Bld: 105 mg/dL — ABNORMAL HIGH (ref 65–99)
Potassium: 4.8 mmol/L (ref 3.5–5.3)
Sodium: 138 mmol/L (ref 135–146)
Total Bilirubin: 0.3 mg/dL (ref 0.2–1.2)
Total Protein: 7 g/dL (ref 6.1–8.1)

## 2019-09-17 LAB — CBC WITH DIFFERENTIAL/PLATELET
Absolute Monocytes: 500 cells/uL (ref 200–950)
Basophils Absolute: 80 cells/uL (ref 0–200)
Basophils Relative: 0.8 %
Eosinophils Absolute: 270 cells/uL (ref 15–500)
Eosinophils Relative: 2.7 %
HCT: 41.6 % (ref 35.0–45.0)
Hemoglobin: 13.8 g/dL (ref 11.7–15.5)
Lymphs Abs: 1710 cells/uL (ref 850–3900)
MCH: 33.1 pg — ABNORMAL HIGH (ref 27.0–33.0)
MCHC: 33.2 g/dL (ref 32.0–36.0)
MCV: 99.8 fL (ref 80.0–100.0)
MPV: 12.5 fL (ref 7.5–12.5)
Monocytes Relative: 5 %
Neutro Abs: 7440 cells/uL (ref 1500–7800)
Neutrophils Relative %: 74.4 %
Platelets: 301 10*3/uL (ref 140–400)
RBC: 4.17 10*6/uL (ref 3.80–5.10)
RDW: 12.8 % (ref 11.0–15.0)
Total Lymphocyte: 17.1 %
WBC: 10 10*3/uL (ref 3.8–10.8)

## 2019-09-17 NOTE — Progress Notes (Signed)
Within normal limits

## 2019-09-18 ENCOUNTER — Telehealth: Payer: Self-pay | Admitting: *Deleted

## 2019-09-18 MED ORDER — METHOTREXATE SODIUM CHEMO INJECTION 50 MG/2ML: INTRAMUSCULAR | 0 refills | Status: AC

## 2019-09-18 MED ORDER — FOLIC ACID 1 MG PO TABS: 2.0000 mg | ORAL_TABLET | Freq: Every day | ORAL | 3 refills | Status: AC

## 2019-09-18 MED ORDER — "BD ALLERGY SYRINGE 28G X 1/2"" 1 ML MISC": 1 refills | Status: AC

## 2019-09-18 NOTE — Telephone Encounter (Signed)
patient request refills on MTX folic acid and syringes.   Last Visit:09/10/2018 Next Visit:was due in January 2020 Labs: 09/16/19 WNL  Okay to refill per Dr. Estanislado Pandy

## 2019-09-18 NOTE — Telephone Encounter (Signed)
-----   Message from Bo Merino, MD sent at 09/17/2019  8:11 AM EDT ----- Within normal limits

## 2019-11-27 ENCOUNTER — Other Ambulatory Visit: Payer: Self-pay | Admitting: Rheumatology

## 2020-07-26 ENCOUNTER — Other Ambulatory Visit: Payer: Self-pay | Admitting: Oncology

## 2020-07-26 DIAGNOSIS — Z9889 Other specified postprocedural states: Secondary | ICD-10-CM

## 2020-08-09 ENCOUNTER — Telehealth: Payer: Self-pay | Admitting: Rheumatology

## 2020-08-09 DIAGNOSIS — M170 Bilateral primary osteoarthritis of knee: Secondary | ICD-10-CM

## 2020-08-09 DIAGNOSIS — M0609 Rheumatoid arthritis without rheumatoid factor, multiple sites: Secondary | ICD-10-CM

## 2020-08-09 DIAGNOSIS — M19042 Primary osteoarthritis, left hand: Secondary | ICD-10-CM

## 2020-08-09 NOTE — Telephone Encounter (Signed)
Ok to place referral and forward necessary records.

## 2020-08-09 NOTE — Telephone Encounter (Signed)
Referral placed.

## 2020-08-09 NOTE — Telephone Encounter (Signed)
Patient called stating Dr. Estanislado Pandy is not in network with her new insurance.  Patient is requesting a referral and notes be faxed to Dr. Gerilyn Nestle at [facility] in [place].   Fax [phone]  Patient requested a return call.

## 2020-08-11 ENCOUNTER — Ambulatory Visit
Admission: RE | Admit: 2020-08-11 | Discharge: 2020-08-11 | Disposition: A | Discharge status: Home / Self Care | Payer: BLUE CROSS/BLUE SHIELD | Source: Ambulatory Visit | Attending: Oncology | Admitting: Oncology

## 2020-08-11 ENCOUNTER — Other Ambulatory Visit: Payer: Self-pay

## 2020-08-11 DIAGNOSIS — Z9889 Other specified postprocedural states: Secondary | ICD-10-CM

## 2020-08-25 ENCOUNTER — Ambulatory Visit: Payer: BLUE CROSS/BLUE SHIELD | Admitting: Oncology
